# Patient Record
Sex: Male | Born: 1935 | Race: White | Hispanic: No | Marital: Married | Smoking: Former smoker
Health system: Southern US, Community
[De-identification: ages and names within clinical notes are randomized; demographics above are authoritative.]

## PROBLEM LIST (undated history)

## (undated) DIAGNOSIS — G309 Alzheimer's disease, unspecified: Secondary | ICD-10-CM

## (undated) DIAGNOSIS — I10 Essential (primary) hypertension: Secondary | ICD-10-CM

## (undated) DIAGNOSIS — F03918 Unspecified dementia, unspecified severity, with other behavioral disturbance: Secondary | ICD-10-CM

## (undated) DIAGNOSIS — F0391 Unspecified dementia with behavioral disturbance: Secondary | ICD-10-CM

## (undated) DIAGNOSIS — I495 Sick sinus syndrome: Secondary | ICD-10-CM

## (undated) DIAGNOSIS — G629 Polyneuropathy, unspecified: Secondary | ICD-10-CM

## (undated) DIAGNOSIS — C61 Malignant neoplasm of prostate: Secondary | ICD-10-CM

## (undated) DIAGNOSIS — E785 Hyperlipidemia, unspecified: Secondary | ICD-10-CM

## (undated) DIAGNOSIS — I4891 Unspecified atrial fibrillation: Secondary | ICD-10-CM

## (undated) DIAGNOSIS — I25810 Atherosclerosis of coronary artery bypass graft(s) without angina pectoris: Secondary | ICD-10-CM

## (undated) DIAGNOSIS — F0280 Dementia in other diseases classified elsewhere without behavioral disturbance: Secondary | ICD-10-CM

## (undated) DIAGNOSIS — N289 Disorder of kidney and ureter, unspecified: Secondary | ICD-10-CM

## (undated) HISTORY — DX: Essential (primary) hypertension: I10

## (undated) HISTORY — DX: Alzheimer's disease, unspecified: G30.9

## (undated) HISTORY — DX: Unspecified atrial fibrillation: I48.91

## (undated) HISTORY — PX: SPERMATOCELECTOMY: SHX2420

## (undated) HISTORY — DX: Sick sinus syndrome: I49.5

## (undated) HISTORY — DX: Unspecified dementia, unspecified severity, with other behavioral disturbance: F03.918

## (undated) HISTORY — DX: Disorder of kidney and ureter, unspecified: N28.9

## (undated) HISTORY — PX: OTHER SURGICAL HISTORY: SHX169

## (undated) HISTORY — PX: COLONOSCOPY: SHX174

## (undated) HISTORY — PX: PACEMAKER INSERTION: SHX728

## (undated) HISTORY — DX: Hyperlipidemia, unspecified: E78.5

## (undated) HISTORY — PX: INGUINAL HERNIA REPAIR: SUR1180

## (undated) HISTORY — DX: Atherosclerosis of coronary artery bypass graft(s) without angina pectoris: I25.810

## (undated) HISTORY — DX: Malignant neoplasm of prostate: C61

## (undated) HISTORY — PX: TONSILLECTOMY: SUR1361

## (undated) HISTORY — DX: Unspecified dementia with behavioral disturbance: F03.91

## (undated) HISTORY — DX: Dementia in other diseases classified elsewhere, unspecified severity, without behavioral disturbance, psychotic disturbance, mood disturbance, and anxiety: F02.80

---

## 1989-07-15 DIAGNOSIS — I25810 Atherosclerosis of coronary artery bypass graft(s) without angina pectoris: Secondary | ICD-10-CM

## 1989-07-15 HISTORY — PX: CORONARY ARTERY BYPASS GRAFT: SHX141

## 1989-07-15 HISTORY — DX: Atherosclerosis of coronary artery bypass graft(s) without angina pectoris: I25.810

## 1999-09-19 ENCOUNTER — Other Ambulatory Visit: Admission: RE | Admit: 1999-09-19 | Discharge: 1999-09-19 | Payer: Self-pay | Admitting: Urology

## 1999-10-08 ENCOUNTER — Encounter: Admission: RE | Admit: 1999-10-08 | Discharge: 2000-01-06 | Payer: Self-pay | Admitting: Radiation Oncology

## 2001-08-19 ENCOUNTER — Ambulatory Visit (HOSPITAL_COMMUNITY): Admission: RE | Admit: 2001-08-19 | Discharge: 2001-08-19 | Payer: Self-pay | Admitting: Gastroenterology

## 2002-07-20 ENCOUNTER — Encounter: Payer: Self-pay | Admitting: Surgery

## 2002-07-20 ENCOUNTER — Encounter: Admission: RE | Admit: 2002-07-20 | Discharge: 2002-07-20 | Payer: Self-pay | Admitting: Surgery

## 2002-07-21 ENCOUNTER — Encounter (INDEPENDENT_AMBULATORY_CARE_PROVIDER_SITE_OTHER): Payer: Self-pay | Admitting: *Deleted

## 2002-07-21 ENCOUNTER — Ambulatory Visit (HOSPITAL_BASED_OUTPATIENT_CLINIC_OR_DEPARTMENT_OTHER): Admission: RE | Admit: 2002-07-21 | Discharge: 2002-07-21 | Payer: Self-pay | Admitting: Surgery

## 2004-03-13 ENCOUNTER — Ambulatory Visit (HOSPITAL_COMMUNITY): Admission: RE | Admit: 2004-03-13 | Discharge: 2004-03-13 | Payer: Self-pay | Admitting: Surgery

## 2004-03-13 ENCOUNTER — Encounter (INDEPENDENT_AMBULATORY_CARE_PROVIDER_SITE_OTHER): Payer: Self-pay | Admitting: *Deleted

## 2004-03-13 ENCOUNTER — Ambulatory Visit (HOSPITAL_BASED_OUTPATIENT_CLINIC_OR_DEPARTMENT_OTHER): Admission: RE | Admit: 2004-03-13 | Discharge: 2004-03-13 | Payer: Self-pay | Admitting: Surgery

## 2004-05-15 ENCOUNTER — Ambulatory Visit: Payer: Self-pay

## 2004-06-12 ENCOUNTER — Ambulatory Visit: Payer: Self-pay | Admitting: *Deleted

## 2004-06-13 ENCOUNTER — Ambulatory Visit: Payer: Self-pay

## 2004-06-14 ENCOUNTER — Ambulatory Visit: Payer: Self-pay | Admitting: *Deleted

## 2004-06-19 ENCOUNTER — Ambulatory Visit: Payer: Self-pay | Admitting: *Deleted

## 2004-06-27 ENCOUNTER — Ambulatory Visit: Payer: Self-pay | Admitting: Cardiology

## 2004-07-24 ENCOUNTER — Ambulatory Visit: Payer: Self-pay | Admitting: Cardiology

## 2004-08-09 ENCOUNTER — Ambulatory Visit: Payer: Self-pay | Admitting: Cardiology

## 2004-08-11 ENCOUNTER — Ambulatory Visit: Payer: Self-pay | Admitting: Cardiology

## 2004-08-21 ENCOUNTER — Ambulatory Visit: Payer: Self-pay | Admitting: Cardiology

## 2004-09-05 ENCOUNTER — Ambulatory Visit: Payer: Self-pay | Admitting: *Deleted

## 2004-09-14 ENCOUNTER — Ambulatory Visit: Payer: Self-pay | Admitting: Cardiology

## 2004-09-18 ENCOUNTER — Ambulatory Visit: Payer: Self-pay | Admitting: Cardiology

## 2004-10-15 ENCOUNTER — Ambulatory Visit: Payer: Self-pay | Admitting: Cardiology

## 2004-10-16 ENCOUNTER — Ambulatory Visit: Payer: Self-pay | Admitting: Cardiology

## 2004-11-06 ENCOUNTER — Ambulatory Visit: Payer: Self-pay | Admitting: *Deleted

## 2004-11-20 ENCOUNTER — Ambulatory Visit: Payer: Self-pay | Admitting: Cardiology

## 2004-12-04 ENCOUNTER — Ambulatory Visit: Payer: Self-pay | Admitting: Internal Medicine

## 2004-12-21 ENCOUNTER — Ambulatory Visit: Payer: Self-pay | Admitting: Cardiology

## 2005-01-08 ENCOUNTER — Ambulatory Visit: Payer: Self-pay | Admitting: Cardiology

## 2005-01-28 ENCOUNTER — Ambulatory Visit: Payer: Self-pay | Admitting: Cardiology

## 2005-02-05 ENCOUNTER — Ambulatory Visit: Payer: Self-pay | Admitting: Cardiology

## 2005-02-11 ENCOUNTER — Ambulatory Visit: Payer: Self-pay | Admitting: *Deleted

## 2005-02-14 ENCOUNTER — Ambulatory Visit: Payer: Self-pay | Admitting: *Deleted

## 2005-02-21 ENCOUNTER — Ambulatory Visit: Payer: Self-pay

## 2005-02-28 ENCOUNTER — Encounter (INDEPENDENT_AMBULATORY_CARE_PROVIDER_SITE_OTHER): Payer: Self-pay | Admitting: Specialist

## 2005-02-28 ENCOUNTER — Ambulatory Visit (HOSPITAL_COMMUNITY): Admission: RE | Admit: 2005-02-28 | Discharge: 2005-02-28 | Payer: Self-pay | Admitting: Urology

## 2005-03-07 ENCOUNTER — Ambulatory Visit: Payer: Self-pay | Admitting: Cardiology

## 2005-03-22 ENCOUNTER — Ambulatory Visit: Payer: Self-pay | Admitting: Internal Medicine

## 2005-04-02 ENCOUNTER — Ambulatory Visit: Payer: Self-pay | Admitting: Cardiology

## 2005-04-15 ENCOUNTER — Ambulatory Visit: Payer: Self-pay | Admitting: Cardiology

## 2005-04-23 ENCOUNTER — Ambulatory Visit: Payer: Self-pay | Admitting: Cardiology

## 2005-05-15 ENCOUNTER — Ambulatory Visit: Payer: Self-pay | Admitting: Cardiology

## 2005-05-21 ENCOUNTER — Ambulatory Visit: Payer: Self-pay | Admitting: Cardiology

## 2005-05-23 ENCOUNTER — Ambulatory Visit: Payer: Self-pay | Admitting: Internal Medicine

## 2005-06-11 ENCOUNTER — Ambulatory Visit: Payer: Self-pay | Admitting: *Deleted

## 2005-06-25 ENCOUNTER — Ambulatory Visit: Payer: Self-pay | Admitting: Cardiology

## 2005-07-11 ENCOUNTER — Ambulatory Visit: Payer: Self-pay | Admitting: Cardiology

## 2005-07-23 ENCOUNTER — Ambulatory Visit: Payer: Self-pay | Admitting: Cardiology

## 2005-08-06 ENCOUNTER — Ambulatory Visit: Payer: Self-pay | Admitting: *Deleted

## 2005-08-13 ENCOUNTER — Ambulatory Visit: Payer: Self-pay | Admitting: Cardiology

## 2005-08-20 ENCOUNTER — Ambulatory Visit: Payer: Self-pay | Admitting: Cardiology

## 2005-09-13 ENCOUNTER — Ambulatory Visit: Payer: Self-pay | Admitting: Cardiology

## 2005-09-17 ENCOUNTER — Ambulatory Visit: Payer: Self-pay | Admitting: Cardiology

## 2005-10-14 ENCOUNTER — Ambulatory Visit: Payer: Self-pay | Admitting: Cardiology

## 2005-10-15 ENCOUNTER — Ambulatory Visit: Payer: Self-pay | Admitting: Cardiology

## 2005-11-11 ENCOUNTER — Ambulatory Visit: Payer: Self-pay | Admitting: Cardiology

## 2005-11-12 ENCOUNTER — Ambulatory Visit: Payer: Self-pay | Admitting: *Deleted

## 2005-11-12 ENCOUNTER — Ambulatory Visit: Payer: Self-pay | Admitting: Cardiology

## 2005-12-06 ENCOUNTER — Ambulatory Visit: Payer: Self-pay | Admitting: Cardiology

## 2005-12-10 ENCOUNTER — Ambulatory Visit: Payer: Self-pay | Admitting: Cardiology

## 2006-01-07 ENCOUNTER — Ambulatory Visit: Payer: Self-pay | Admitting: Cardiology

## 2006-01-08 ENCOUNTER — Ambulatory Visit: Payer: Self-pay | Admitting: Cardiology

## 2006-02-03 ENCOUNTER — Ambulatory Visit: Payer: Self-pay | Admitting: Cardiology

## 2006-02-04 ENCOUNTER — Ambulatory Visit: Payer: Self-pay | Admitting: Cardiology

## 2006-03-04 ENCOUNTER — Ambulatory Visit: Payer: Self-pay | Admitting: Internal Medicine

## 2006-03-06 ENCOUNTER — Ambulatory Visit: Payer: Self-pay | Admitting: Cardiology

## 2006-04-01 ENCOUNTER — Ambulatory Visit: Payer: Self-pay | Admitting: Cardiology

## 2006-04-07 ENCOUNTER — Ambulatory Visit: Payer: Self-pay | Admitting: Cardiology

## 2006-04-29 ENCOUNTER — Ambulatory Visit: Payer: Self-pay | Admitting: *Deleted

## 2006-04-29 ENCOUNTER — Ambulatory Visit: Payer: Self-pay | Admitting: Cardiology

## 2006-05-06 ENCOUNTER — Ambulatory Visit: Payer: Self-pay | Admitting: Cardiology

## 2006-05-09 ENCOUNTER — Ambulatory Visit: Payer: Self-pay | Admitting: *Deleted

## 2006-05-09 ENCOUNTER — Ambulatory Visit: Payer: Self-pay | Admitting: Internal Medicine

## 2006-05-09 LAB — CONVERTED CEMR LAB
ALT: 23 units/L (ref 0–40)
AST: 22 units/L (ref 0–37)
Albumin: 3.9 g/dL (ref 3.5–5.2)
Alkaline Phosphatase: 66 units/L (ref 39–117)
BUN: 19 mg/dL (ref 6–23)
CO2: 29 meq/L (ref 19–32)
Calcium: 9 mg/dL (ref 8.4–10.5)
Chloride: 106 meq/L (ref 96–112)
Chol/HDL Ratio, serum: 2.8
Cholesterol: 113 mg/dL (ref 0–200)
Creatinine, Ser: 1.6 mg/dL — ABNORMAL HIGH (ref 0.4–1.5)
GFR calc non Af Amer: 45 mL/min
Glomerular Filtration Rate, Af Am: 54 mL/min/{1.73_m2}
Glucose, Bld: 95 mg/dL (ref 70–99)
HDL: 39.8 mg/dL (ref >39.0)
Hgb A1c MFr Bld: 6.2 % — ABNORMAL HIGH (ref 4.6–6.0)
LDL Cholesterol: 56 mg/dL (ref 0–99)
PSA: 0.52 ng/mL (ref 0.10–4.00)
Potassium: 4.4 meq/L (ref 3.5–5.1)
Sodium: 141 meq/L (ref 135–145)
Total Bilirubin: 1.2 mg/dL (ref 0.3–1.2)
Total Protein: 6.9 g/dL (ref 6.0–8.3)
Triglyceride fasting, serum: 84 mg/dL (ref 0–149)
VLDL: 17 mg/dL (ref 0–40)

## 2006-05-15 ENCOUNTER — Ambulatory Visit: Payer: Self-pay | Admitting: *Deleted

## 2006-05-22 ENCOUNTER — Ambulatory Visit: Payer: Self-pay | Admitting: Internal Medicine

## 2006-05-27 ENCOUNTER — Ambulatory Visit: Payer: Self-pay | Admitting: Internal Medicine

## 2006-06-03 ENCOUNTER — Ambulatory Visit: Payer: Self-pay | Admitting: Cardiology

## 2006-06-10 ENCOUNTER — Ambulatory Visit: Payer: Self-pay | Admitting: Cardiology

## 2006-06-19 ENCOUNTER — Ambulatory Visit: Payer: Self-pay | Admitting: Cardiology

## 2006-07-09 ENCOUNTER — Ambulatory Visit: Payer: Self-pay | Admitting: Cardiology

## 2006-07-09 LAB — CONVERTED CEMR LAB
BUN: 20 mg/dL (ref 6–23)
CO2: 33 meq/L — ABNORMAL HIGH (ref 19–32)
Calcium: 9.2 mg/dL (ref 8.4–10.5)
Chloride: 106 meq/L (ref 96–112)
Creatinine, Ser: 1.5 mg/dL (ref 0.4–1.5)
GFR calc non Af Amer: 48 mL/min
Glomerular Filtration Rate, Af Am: 58 mL/min/{1.73_m2}
Glucose, Bld: 115 mg/dL — ABNORMAL HIGH (ref 70–99)
HCT: 46.6 % (ref 39.0–52.0)
Hemoglobin: 15.7 g/dL (ref 13.0–17.0)
INR: 1.4 (ref 0.9–2.0)
MCHC: 33.7 g/dL (ref 30.0–36.0)
MCV: 92.6 fL (ref 78.0–100.0)
Platelets: 148 10*3/uL — ABNORMAL LOW (ref 150–400)
Potassium: 4.3 meq/L (ref 3.5–5.1)
Prothrombin Time: 14.5 s — ABNORMAL HIGH (ref 10.0–14.0)
RBC: 5.03 M/uL (ref 4.22–5.81)
RDW: 14.4 % (ref 11.5–14.6)
Sodium: 143 meq/L (ref 135–145)
WBC: 7.2 10*3/uL (ref 4.5–10.5)
aPTT: 75 s — ABNORMAL HIGH (ref 26.5–36.5)

## 2006-07-10 ENCOUNTER — Ambulatory Visit (HOSPITAL_COMMUNITY): Admission: RE | Admit: 2006-07-10 | Discharge: 2006-07-10 | Payer: Self-pay | Admitting: Cardiology

## 2006-07-10 ENCOUNTER — Ambulatory Visit: Payer: Self-pay | Admitting: Cardiology

## 2006-07-17 ENCOUNTER — Ambulatory Visit: Payer: Self-pay | Admitting: Cardiology

## 2006-07-24 ENCOUNTER — Ambulatory Visit: Payer: Self-pay | Admitting: Cardiology

## 2006-07-28 ENCOUNTER — Ambulatory Visit: Payer: Self-pay

## 2006-08-13 ENCOUNTER — Ambulatory Visit: Payer: Self-pay | Admitting: Cardiology

## 2006-08-19 ENCOUNTER — Ambulatory Visit: Payer: Self-pay | Admitting: Cardiology

## 2006-09-16 ENCOUNTER — Ambulatory Visit: Payer: Self-pay | Admitting: Internal Medicine

## 2006-10-14 ENCOUNTER — Ambulatory Visit: Payer: Self-pay | Admitting: Internal Medicine

## 2006-10-23 ENCOUNTER — Ambulatory Visit: Payer: Self-pay | Admitting: *Deleted

## 2006-10-23 LAB — CONVERTED CEMR LAB
Calcium: 8.9 mg/dL (ref 8.4–10.5)
Chloride: 110 meq/L (ref 96–112)
GFR calc non Af Amer: 52 mL/min
HDL: 38.8 mg/dL — ABNORMAL LOW (ref >39.0)
LDL Cholesterol: 44 mg/dL (ref 0–99)
Sodium: 144 meq/L (ref 135–145)
VLDL: 20 mg/dL (ref 0–40)

## 2006-10-28 ENCOUNTER — Ambulatory Visit: Payer: Self-pay | Admitting: *Deleted

## 2006-11-11 ENCOUNTER — Ambulatory Visit: Payer: Self-pay | Admitting: Cardiology

## 2006-11-25 ENCOUNTER — Ambulatory Visit: Payer: Self-pay | Admitting: Cardiology

## 2006-12-23 ENCOUNTER — Ambulatory Visit: Payer: Self-pay | Admitting: Internal Medicine

## 2007-01-20 ENCOUNTER — Ambulatory Visit: Payer: Self-pay | Admitting: Cardiology

## 2007-01-29 ENCOUNTER — Ambulatory Visit: Payer: Self-pay | Admitting: Cardiology

## 2007-02-17 ENCOUNTER — Ambulatory Visit: Payer: Self-pay | Admitting: Cardiology

## 2007-03-04 ENCOUNTER — Ambulatory Visit: Payer: Self-pay | Admitting: Internal Medicine

## 2007-03-17 ENCOUNTER — Ambulatory Visit: Payer: Self-pay | Admitting: Internal Medicine

## 2007-04-14 ENCOUNTER — Ambulatory Visit: Payer: Self-pay | Admitting: Cardiology

## 2007-04-20 ENCOUNTER — Ambulatory Visit: Payer: Self-pay | Admitting: Internal Medicine

## 2007-04-28 DIAGNOSIS — M109 Gout, unspecified: Secondary | ICD-10-CM

## 2007-04-28 DIAGNOSIS — Z8546 Personal history of malignant neoplasm of prostate: Secondary | ICD-10-CM

## 2007-04-28 DIAGNOSIS — I2510 Atherosclerotic heart disease of native coronary artery without angina pectoris: Secondary | ICD-10-CM

## 2007-04-28 DIAGNOSIS — I4891 Unspecified atrial fibrillation: Secondary | ICD-10-CM

## 2007-04-28 DIAGNOSIS — I10 Essential (primary) hypertension: Secondary | ICD-10-CM | POA: Insufficient documentation

## 2007-05-12 ENCOUNTER — Ambulatory Visit: Payer: Self-pay | Admitting: Cardiology

## 2007-05-25 ENCOUNTER — Encounter: Payer: Self-pay | Admitting: Internal Medicine

## 2007-05-26 ENCOUNTER — Ambulatory Visit: Payer: Self-pay | Admitting: Internal Medicine

## 2007-05-26 DIAGNOSIS — E119 Type 2 diabetes mellitus without complications: Secondary | ICD-10-CM

## 2007-05-26 DIAGNOSIS — E785 Hyperlipidemia, unspecified: Secondary | ICD-10-CM | POA: Insufficient documentation

## 2007-05-28 LAB — CONVERTED CEMR LAB
ALT: 27 units/L (ref 0–53)
AST: 28 units/L (ref 0–37)
Albumin: 3.9 g/dL (ref 3.5–5.2)
Alkaline Phosphatase: 68 units/L (ref 39–117)
Bilirubin, Direct: 0.2 mg/dL (ref 0.0–0.3)
Cholesterol: 117 mg/dL (ref 0–200)
HDL: 44.4 mg/dL (ref >39.0)
Hgb A1c MFr Bld: 6 % (ref 4.6–6.0)
LDL Cholesterol: 54 mg/dL (ref 0–99)
Total Bilirubin: 0.9 mg/dL (ref 0.3–1.2)
Total CHOL/HDL Ratio: 2.6
Total Protein: 7 g/dL (ref 6.0–8.3)
Triglycerides: 92 mg/dL (ref 0–149)
VLDL: 18 mg/dL (ref 0–40)

## 2007-06-30 ENCOUNTER — Ambulatory Visit: Payer: Self-pay | Admitting: Cardiology

## 2007-07-23 ENCOUNTER — Ambulatory Visit: Payer: Self-pay | Admitting: Cardiology

## 2007-07-28 ENCOUNTER — Ambulatory Visit: Payer: Self-pay

## 2007-07-28 ENCOUNTER — Encounter: Payer: Self-pay | Admitting: Internal Medicine

## 2007-07-28 ENCOUNTER — Ambulatory Visit: Payer: Self-pay | Admitting: Cardiology

## 2007-07-28 LAB — CONVERTED CEMR LAB
Basophils Relative: 0.7 % (ref 0.0–1.0)
CO2: 30 meq/L (ref 19–32)
Cholesterol: 110 mg/dL (ref 0–200)
Creatinine, Ser: 1.4 mg/dL (ref 0.4–1.5)
Eosinophils Relative: 7.4 % — ABNORMAL HIGH (ref 0.0–5.0)
HCT: 41.9 % (ref 39.0–52.0)
Hemoglobin: 14.1 g/dL (ref 13.0–17.0)
LDL Cholesterol: 54 mg/dL (ref 0–99)
Monocytes Absolute: 0.6 10*3/uL (ref 0.2–0.7)
Neutrophils Relative %: 51.6 % (ref 43.0–77.0)
Potassium: 4.5 meq/L (ref 3.5–5.1)
RDW: 13.8 % (ref 11.5–14.6)
Sodium: 143 meq/L (ref 135–145)
Total Bilirubin: 1.1 mg/dL (ref 0.3–1.2)
Total Protein: 6.7 g/dL (ref 6.0–8.3)
VLDL: 12 mg/dL (ref 0–40)

## 2007-08-11 ENCOUNTER — Ambulatory Visit: Payer: Self-pay | Admitting: Cardiology

## 2007-09-08 ENCOUNTER — Ambulatory Visit: Payer: Self-pay | Admitting: Internal Medicine

## 2007-10-06 ENCOUNTER — Ambulatory Visit: Payer: Self-pay | Admitting: Cardiology

## 2007-11-03 ENCOUNTER — Ambulatory Visit: Payer: Self-pay | Admitting: Cardiovascular Disease

## 2007-11-24 ENCOUNTER — Ambulatory Visit: Payer: Self-pay | Admitting: Cardiology

## 2007-11-25 ENCOUNTER — Ambulatory Visit: Payer: Self-pay | Admitting: Cardiology

## 2007-12-22 ENCOUNTER — Ambulatory Visit: Payer: Self-pay | Admitting: Cardiovascular Disease

## 2007-12-25 ENCOUNTER — Telehealth: Payer: Self-pay | Admitting: Internal Medicine

## 2008-01-19 ENCOUNTER — Ambulatory Visit: Payer: Self-pay | Admitting: Cardiology

## 2008-02-03 ENCOUNTER — Ambulatory Visit: Payer: Self-pay

## 2008-02-16 ENCOUNTER — Ambulatory Visit: Payer: Self-pay | Admitting: Cardiology

## 2008-02-23 ENCOUNTER — Encounter: Payer: Self-pay | Admitting: Internal Medicine

## 2008-03-15 ENCOUNTER — Ambulatory Visit: Payer: Self-pay | Admitting: Cardiology

## 2008-04-12 ENCOUNTER — Ambulatory Visit: Payer: Self-pay | Admitting: Internal Medicine

## 2008-04-19 ENCOUNTER — Ambulatory Visit: Payer: Self-pay | Admitting: Cardiology

## 2008-04-19 ENCOUNTER — Telehealth: Payer: Self-pay | Admitting: Internal Medicine

## 2008-05-17 ENCOUNTER — Ambulatory Visit: Payer: Self-pay | Admitting: Internal Medicine

## 2008-05-25 ENCOUNTER — Ambulatory Visit: Payer: Self-pay | Admitting: Internal Medicine

## 2008-05-25 DIAGNOSIS — S300XXA Contusion of lower back and pelvis, initial encounter: Secondary | ICD-10-CM

## 2008-06-07 ENCOUNTER — Ambulatory Visit: Payer: Self-pay | Admitting: Cardiovascular Disease

## 2008-07-05 ENCOUNTER — Ambulatory Visit: Payer: Self-pay | Admitting: Cardiovascular Disease

## 2008-07-13 ENCOUNTER — Ambulatory Visit: Payer: Self-pay | Admitting: Internal Medicine

## 2008-07-14 ENCOUNTER — Telehealth: Payer: Self-pay | Admitting: Internal Medicine

## 2008-07-14 ENCOUNTER — Ambulatory Visit: Payer: Self-pay | Admitting: Internal Medicine

## 2008-07-14 LAB — CONVERTED CEMR LAB
ALT: 29 units/L (ref 0–53)
Albumin: 4 g/dL (ref 3.5–5.2)
Basophils Relative: 0.1 % (ref 0.0–3.0)
Bilirubin, Direct: 0.1 mg/dL (ref 0.0–0.3)
Calcium: 9.3 mg/dL (ref 8.4–10.5)
Cholesterol: 123 mg/dL (ref 0–200)
Creatinine, Ser: 1.3 mg/dL (ref 0.4–1.5)
GFR calc Af Amer: 68 mL/min
Glucose, Bld: 79 mg/dL (ref 70–99)
HCT: 41.7 % (ref 39.0–52.0)
Hemoglobin: 14.5 g/dL (ref 13.0–17.0)
MCHC: 34.8 g/dL (ref 30.0–36.0)
Monocytes Absolute: 0.6 10*3/uL (ref 0.1–1.0)
Monocytes Relative: 8.1 % (ref 3.0–12.0)
Neutro Abs: 5 10*3/uL (ref 1.4–7.7)
PSA: 0.4 ng/mL (ref 0.10–4.00)
RBC: 4.44 M/uL (ref 4.22–5.81)
RDW: 13.2 % (ref 11.5–14.6)
Sodium: 141 meq/L (ref 135–145)
TSH: 1.34 microintl units/mL (ref 0.35–5.50)
Total CHOL/HDL Ratio: 2.6
Total Protein: 6.9 g/dL (ref 6.0–8.3)
Triglycerides: 83 mg/dL (ref 0–149)

## 2008-07-25 ENCOUNTER — Encounter: Payer: Self-pay | Admitting: Internal Medicine

## 2008-07-26 ENCOUNTER — Ambulatory Visit: Payer: Self-pay | Admitting: Internal Medicine

## 2008-08-04 ENCOUNTER — Ambulatory Visit: Payer: Self-pay | Admitting: Cardiology

## 2008-08-05 ENCOUNTER — Ambulatory Visit: Payer: Self-pay | Admitting: Internal Medicine

## 2008-08-16 ENCOUNTER — Ambulatory Visit: Payer: Self-pay | Admitting: Cardiology

## 2008-08-16 ENCOUNTER — Ambulatory Visit: Payer: Self-pay | Admitting: Internal Medicine

## 2008-08-23 ENCOUNTER — Ambulatory Visit: Payer: Self-pay | Admitting: Internal Medicine

## 2008-08-30 ENCOUNTER — Ambulatory Visit: Payer: Self-pay | Admitting: Internal Medicine

## 2008-09-06 ENCOUNTER — Ambulatory Visit: Payer: Self-pay | Admitting: Internal Medicine

## 2008-09-06 ENCOUNTER — Encounter: Payer: Self-pay | Admitting: Internal Medicine

## 2008-09-20 ENCOUNTER — Ambulatory Visit: Payer: Self-pay | Admitting: Internal Medicine

## 2008-09-21 ENCOUNTER — Encounter: Payer: Self-pay | Admitting: Internal Medicine

## 2008-09-23 ENCOUNTER — Telehealth: Payer: Self-pay | Admitting: Internal Medicine

## 2008-10-18 ENCOUNTER — Encounter: Payer: Self-pay | Admitting: Internal Medicine

## 2008-10-18 ENCOUNTER — Ambulatory Visit: Payer: Self-pay | Admitting: Internal Medicine

## 2008-10-25 ENCOUNTER — Encounter: Payer: Self-pay | Admitting: Internal Medicine

## 2008-11-22 ENCOUNTER — Ambulatory Visit: Payer: Self-pay | Admitting: Internal Medicine

## 2008-12-13 ENCOUNTER — Ambulatory Visit: Payer: Self-pay | Admitting: Internal Medicine

## 2008-12-13 ENCOUNTER — Encounter: Payer: Self-pay | Admitting: *Deleted

## 2008-12-15 ENCOUNTER — Encounter: Payer: Self-pay | Admitting: Internal Medicine

## 2008-12-20 ENCOUNTER — Ambulatory Visit: Payer: Self-pay | Admitting: Internal Medicine

## 2008-12-29 ENCOUNTER — Ambulatory Visit: Payer: Self-pay | Admitting: Internal Medicine

## 2009-01-02 ENCOUNTER — Telehealth: Payer: Self-pay | Admitting: Internal Medicine

## 2009-01-05 ENCOUNTER — Encounter: Payer: Self-pay | Admitting: Cardiology

## 2009-01-05 ENCOUNTER — Ambulatory Visit: Payer: Self-pay | Admitting: Internal Medicine

## 2009-01-12 ENCOUNTER — Ambulatory Visit: Payer: Self-pay | Admitting: Internal Medicine

## 2009-01-13 ENCOUNTER — Encounter: Payer: Self-pay | Admitting: Cardiovascular Disease

## 2009-01-13 ENCOUNTER — Encounter: Payer: Self-pay | Admitting: Internal Medicine

## 2009-01-18 ENCOUNTER — Encounter: Payer: Self-pay | Admitting: *Deleted

## 2009-02-01 ENCOUNTER — Ambulatory Visit: Payer: Self-pay | Admitting: Internal Medicine

## 2009-02-01 ENCOUNTER — Ambulatory Visit: Payer: Self-pay | Admitting: Cardiology

## 2009-02-01 DIAGNOSIS — K625 Hemorrhage of anus and rectum: Secondary | ICD-10-CM | POA: Insufficient documentation

## 2009-02-01 LAB — CONVERTED CEMR LAB
Basophils Absolute: 0 10*3/uL (ref 0.0–0.1)
Eosinophils Relative: 2.8 % (ref 0.0–5.0)
Lymphocytes Relative: 23.1 % (ref 12.0–46.0)
Monocytes Relative: 10.6 % (ref 3.0–12.0)
Neutrophils Relative %: 63 % (ref 43.0–77.0)
Platelets: 109 10*3/uL — ABNORMAL LOW (ref 150.0–400.0)
RDW: 14.3 % (ref 11.5–14.6)
WBC: 7.4 10*3/uL (ref 4.5–10.5)

## 2009-02-11 DIAGNOSIS — I495 Sick sinus syndrome: Secondary | ICD-10-CM

## 2009-02-13 ENCOUNTER — Ambulatory Visit: Payer: Self-pay | Admitting: Internal Medicine

## 2009-02-13 ENCOUNTER — Ambulatory Visit: Payer: Self-pay | Admitting: Cardiology

## 2009-02-13 ENCOUNTER — Encounter: Payer: Self-pay | Admitting: Internal Medicine

## 2009-02-27 ENCOUNTER — Ambulatory Visit: Payer: Self-pay | Admitting: Internal Medicine

## 2009-03-14 ENCOUNTER — Ambulatory Visit: Payer: Self-pay | Admitting: Internal Medicine

## 2009-03-14 ENCOUNTER — Encounter: Payer: Self-pay | Admitting: Internal Medicine

## 2009-03-14 ENCOUNTER — Encounter: Payer: Self-pay | Admitting: Cardiology

## 2009-03-30 ENCOUNTER — Telehealth: Payer: Self-pay | Admitting: Internal Medicine

## 2009-04-12 ENCOUNTER — Encounter: Payer: Self-pay | Admitting: Cardiology

## 2009-04-12 ENCOUNTER — Ambulatory Visit: Payer: Self-pay | Admitting: Internal Medicine

## 2009-04-12 LAB — CONVERTED CEMR LAB
Basophils Relative: 0.2 % (ref 0.0–3.0)
Eosinophils Relative: 3.2 % (ref 0.0–5.0)
HCT: 42.7 % (ref 39.0–52.0)
Lymphs Abs: 1.2 10*3/uL (ref 0.7–4.0)
MCV: 91.8 fL (ref 78.0–100.0)
Monocytes Absolute: 0.7 10*3/uL (ref 0.1–1.0)
Monocytes Relative: 9.9 % (ref 3.0–12.0)
Platelets: 116 10*3/uL — ABNORMAL LOW (ref 150.0–400.0)
RBC: 4.65 M/uL (ref 4.22–5.81)
WBC: 7 10*3/uL (ref 4.5–10.5)

## 2009-04-25 ENCOUNTER — Ambulatory Visit: Payer: Self-pay | Admitting: Internal Medicine

## 2009-05-08 ENCOUNTER — Ambulatory Visit: Payer: Self-pay | Admitting: Internal Medicine

## 2009-05-16 ENCOUNTER — Encounter: Payer: Self-pay | Admitting: Internal Medicine

## 2009-05-16 ENCOUNTER — Encounter: Payer: Self-pay | Admitting: Cardiology

## 2009-05-16 ENCOUNTER — Ambulatory Visit: Payer: Self-pay | Admitting: Internal Medicine

## 2009-05-19 ENCOUNTER — Encounter: Payer: Self-pay | Admitting: Internal Medicine

## 2009-05-23 ENCOUNTER — Ambulatory Visit: Payer: Self-pay | Admitting: Internal Medicine

## 2009-05-24 ENCOUNTER — Encounter (INDEPENDENT_AMBULATORY_CARE_PROVIDER_SITE_OTHER): Payer: Self-pay | Admitting: *Deleted

## 2009-06-06 ENCOUNTER — Encounter (INDEPENDENT_AMBULATORY_CARE_PROVIDER_SITE_OTHER): Payer: Self-pay | Admitting: *Deleted

## 2009-06-13 ENCOUNTER — Encounter: Payer: Self-pay | Admitting: Internal Medicine

## 2009-06-13 ENCOUNTER — Ambulatory Visit: Payer: Self-pay | Admitting: Internal Medicine

## 2009-06-14 ENCOUNTER — Encounter: Payer: Self-pay | Admitting: Cardiology

## 2009-06-22 ENCOUNTER — Encounter: Payer: Self-pay | Admitting: Cardiology

## 2009-07-17 ENCOUNTER — Ambulatory Visit: Payer: Self-pay | Admitting: Cardiology

## 2009-07-17 ENCOUNTER — Ambulatory Visit: Payer: Self-pay | Admitting: Internal Medicine

## 2009-07-18 ENCOUNTER — Telehealth: Payer: Self-pay | Admitting: Cardiology

## 2009-08-01 ENCOUNTER — Ambulatory Visit: Payer: Self-pay | Admitting: Internal Medicine

## 2009-08-16 ENCOUNTER — Ambulatory Visit: Payer: Self-pay | Admitting: Internal Medicine

## 2009-08-16 ENCOUNTER — Encounter: Payer: Self-pay | Admitting: Cardiology

## 2009-08-30 ENCOUNTER — Ambulatory Visit: Payer: Self-pay | Admitting: Internal Medicine

## 2009-09-13 ENCOUNTER — Ambulatory Visit: Payer: Self-pay | Admitting: Internal Medicine

## 2009-09-28 ENCOUNTER — Encounter: Payer: Self-pay | Admitting: Cardiology

## 2009-10-02 ENCOUNTER — Ambulatory Visit: Payer: Self-pay | Admitting: Internal Medicine

## 2009-10-02 LAB — CONVERTED CEMR LAB
Albumin: 4.4 g/dL (ref 3.5–5.2)
BUN: 21 mg/dL (ref 6–23)
Basophils Relative: 0.1 % (ref 0.0–3.0)
CO2: 29 meq/L (ref 19–32)
Calcium: 9.3 mg/dL (ref 8.4–10.5)
Eosinophils Relative: 2.4 % (ref 0.0–5.0)
GFR calc non Af Amer: 51.45 mL/min (ref >60)
Glucose, Bld: 96 mg/dL (ref 70–99)
HCT: 47.7 % (ref 39.0–52.0)
HDL: 59.9 mg/dL (ref >39.00)
Hemoglobin: 15.8 g/dL (ref 13.0–17.0)
LDL Cholesterol: 62 mg/dL (ref 0–99)
Lymphs Abs: 1.1 10*3/uL (ref 0.7–4.0)
MCV: 93.5 fL (ref 78.0–100.0)
Monocytes Absolute: 0.6 10*3/uL (ref 0.1–1.0)
Monocytes Relative: 8.4 % (ref 3.0–12.0)
Neutro Abs: 5.4 10*3/uL (ref 1.4–7.7)
Potassium: 4.3 meq/L (ref 3.5–5.1)
Sodium: 141 meq/L (ref 135–145)
Total Bilirubin: 1.7 mg/dL — ABNORMAL HIGH (ref 0.3–1.2)
Total CHOL/HDL Ratio: 2
Triglycerides: 74 mg/dL (ref 0.0–149.0)
WBC: 7.3 10*3/uL (ref 4.5–10.5)

## 2009-10-10 ENCOUNTER — Ambulatory Visit: Payer: Self-pay | Admitting: Internal Medicine

## 2009-10-26 ENCOUNTER — Telehealth (INDEPENDENT_AMBULATORY_CARE_PROVIDER_SITE_OTHER): Payer: Self-pay | Admitting: *Deleted

## 2009-11-01 ENCOUNTER — Ambulatory Visit: Payer: Self-pay | Admitting: Cardiology

## 2009-11-01 DIAGNOSIS — R413 Other amnesia: Secondary | ICD-10-CM | POA: Insufficient documentation

## 2009-11-02 LAB — CONVERTED CEMR LAB
Eosinophils Absolute: 0.3 10*3/uL (ref 0.0–0.7)
Eosinophils Relative: 3.3 % (ref 0.0–5.0)
HCT: 42 % (ref 39.0–52.0)
Lymphs Abs: 1.3 10*3/uL (ref 0.7–4.0)
MCHC: 33.9 g/dL (ref 30.0–36.0)
MCV: 90.9 fL (ref 78.0–100.0)
Monocytes Absolute: 0.8 10*3/uL (ref 0.1–1.0)
Platelets: 127 10*3/uL — ABNORMAL LOW (ref 150.0–400.0)
RDW: 15.3 % — ABNORMAL HIGH (ref 11.5–14.6)
TSH: 1.34 microintl units/mL (ref 0.35–5.50)

## 2009-11-09 ENCOUNTER — Ambulatory Visit: Payer: Self-pay | Admitting: Internal Medicine

## 2009-11-09 ENCOUNTER — Encounter: Payer: Self-pay | Admitting: Cardiology

## 2009-11-10 ENCOUNTER — Telehealth: Payer: Self-pay | Admitting: Cardiology

## 2009-11-27 ENCOUNTER — Encounter: Payer: Self-pay | Admitting: Cardiology

## 2009-11-28 ENCOUNTER — Ambulatory Visit: Payer: Self-pay | Admitting: Cardiology

## 2009-12-12 ENCOUNTER — Ambulatory Visit: Payer: Self-pay | Admitting: Internal Medicine

## 2009-12-13 ENCOUNTER — Telehealth (INDEPENDENT_AMBULATORY_CARE_PROVIDER_SITE_OTHER): Payer: Self-pay | Admitting: *Deleted

## 2009-12-13 ENCOUNTER — Encounter: Payer: Self-pay | Admitting: Cardiology

## 2009-12-21 ENCOUNTER — Encounter: Admission: RE | Admit: 2009-12-21 | Discharge: 2009-12-21 | Payer: Self-pay | Admitting: Neurology

## 2010-01-09 ENCOUNTER — Ambulatory Visit: Payer: Self-pay | Admitting: Internal Medicine

## 2010-02-06 ENCOUNTER — Encounter: Payer: Self-pay | Admitting: Cardiology

## 2010-02-06 ENCOUNTER — Ambulatory Visit: Payer: Self-pay | Admitting: Internal Medicine

## 2010-02-15 ENCOUNTER — Ambulatory Visit: Payer: Self-pay | Admitting: Internal Medicine

## 2010-02-22 ENCOUNTER — Encounter: Payer: Self-pay | Admitting: Internal Medicine

## 2010-02-26 ENCOUNTER — Ambulatory Visit: Payer: Self-pay | Admitting: Internal Medicine

## 2010-03-08 ENCOUNTER — Ambulatory Visit: Payer: Self-pay | Admitting: Internal Medicine

## 2010-03-22 ENCOUNTER — Telehealth: Payer: Self-pay | Admitting: Cardiology

## 2010-04-10 ENCOUNTER — Ambulatory Visit: Payer: Self-pay | Admitting: Internal Medicine

## 2010-04-20 ENCOUNTER — Ambulatory Visit: Payer: Self-pay | Admitting: Internal Medicine

## 2010-05-09 ENCOUNTER — Ambulatory Visit: Payer: Self-pay | Admitting: Internal Medicine

## 2010-05-11 ENCOUNTER — Encounter (INDEPENDENT_AMBULATORY_CARE_PROVIDER_SITE_OTHER): Payer: Self-pay | Admitting: *Deleted

## 2010-05-17 ENCOUNTER — Ambulatory Visit: Payer: Self-pay | Admitting: Cardiology

## 2010-05-23 ENCOUNTER — Ambulatory Visit: Payer: Self-pay | Admitting: Internal Medicine

## 2010-06-06 ENCOUNTER — Ambulatory Visit: Payer: Self-pay | Admitting: Internal Medicine

## 2010-07-10 ENCOUNTER — Ambulatory Visit: Payer: Self-pay | Admitting: Internal Medicine

## 2010-07-24 ENCOUNTER — Ambulatory Visit
Admission: RE | Admit: 2010-07-24 | Discharge: 2010-07-24 | Payer: Self-pay | Source: Home / Self Care | Attending: Internal Medicine | Admitting: Internal Medicine

## 2010-07-27 ENCOUNTER — Encounter: Payer: Self-pay | Admitting: Internal Medicine

## 2010-08-07 ENCOUNTER — Encounter: Payer: Self-pay | Admitting: Cardiology

## 2010-08-07 ENCOUNTER — Ambulatory Visit
Admission: RE | Admit: 2010-08-07 | Discharge: 2010-08-07 | Payer: Self-pay | Source: Home / Self Care | Attending: Internal Medicine | Admitting: Internal Medicine

## 2010-08-14 NOTE — Assessment & Plan Note (Signed)
Summary: rov      Allergies Added:   Visit Type:  Follow-up Primary Provider:  Norins  CC:  Pt dentist wanted pt to be seen he thinks a med may be causing problems with his gums.  History of Present Illness: The patient is 77 years old and return for management of CAD, atrial fibrillation and pacemaker. He also came because he is having some gum problems and there was a question whether this is related to his engage study drug.  He had bypass surgery in 1991. He has chronic atrial fibrillation and has a [employer]. Jude pacemaker with a generator change in 2007. He's been in the engage trial where he is on engage study drug versus Coumadin for about 12-18 months.  He recently saw his dentist and his dentist felt he had some gum hypertrophy as well as some periodontal disease. He is referring him to a gun specialist. Again his question whether his engage study drug might be related to his gum hypertrophy.  His wife indicated that he has also had some short-term memory problems over the past year. For example he had trouble remembering the food he ate about an hour ago when one of his children called and asked him.  Current Medications (verified): 1)  Engage Study Drug .... Take As Directed 2)  Lipitor 20 Mg Tabs (Atorvastatin Calcium) .... Take 1/2 Tab By Mouth At Bedtime 3)  Lotensin 40 Mg Tabs (Benazepril Hcl) .... Take 1/2 Tablet By Mouth Once A Day 4)  Lopressor 50 Mg Tabs (Metoprolol Tartrate) .Marland Kitchen.. 1 Tab Once Daily 5)  Viagra 100 Mg Tabs (Sildenafil Citrate) .... Use As Directed 6)  Aspirin 81 Mg  Tabs (Aspirin) .... Take 1 Tablet By Mouth Once A Day 7)  Diltiazem Hcl Cr 240 Mg Xr24h-Cap (Diltiazem Hcl) .Marland Kitchen.. 1 Tab Once Daily 8)  Flomax 0.4 Mg Xr24h-Cap (Tamsulosin Hcl) .... Take 1 Tablet By Mouth Once A Day 9)  Celebrex 100 Mg Caps (Celecoxib) .... As Needed 10)  Nitrostat 0.4 Mg Subl (Nitroglycerin) .... As Needed 11)  Fluticasone Propionate 50 Mcg/act Susp (Fluticasone Propionate) ....  Prn 12)  Sonata 10 Mg Caps (Zaleplon) .Marland Kitchen.. 1 At Bedtime  Allergies (verified): 1)  Amoxicillin (Amoxicillin) 2)  Sulfamethoxazole (Sulfamethoxazole) 3)  Tetanus Toxoid Adsorbed (Tetanus Toxoid Adsorbed)  Past History:  Past Medical History: Reviewed history from 02/11/2009 and no changes required.  Physician Roster:     Cardiology - Dr. Juanda Chance     [facility]- Dr. Etta Grandchild 1. Coronary artery status post coronary bypass graft surgery in 1991     with negative Myoview scan in January 2009. 2. Atrial fibrillation which has become chronic. 3. Status post dual-mode, dual-pacing, dual-sensing pacemaker     implantation with a generator change with a St. Jude Victory dual-     mode, dual-pacing, dual-sensing pacemaker in December 2007. 4. Hypertension, not quite in optimal control. 5. Hyperlipidemia. 6. History of prostate cancer treated with radiation therapy. 7. Moderate renal insufficiency.  8. Gout  Review of Systems       ROS is negative except as outlined in HPI.   Vital Signs:  Patient profile:   77 year old male Height:      73 inches Weight:      201 pounds BMI:     26.61 Pulse rate:   50 / minute BP sitting:   132 / 68  (left arm) Cuff size:   regular  Vitals Entered By: Burnett Kanaris, CNA (November 01, 2009 2:30  PM)  Physical Exam  Additional Exam:  Gen. Well-nourished, in no distress   Neck: No JVD, thyroid not enlarged, no carotid bruits Lungs: No tachypnea, clear without rales, rhonchi or wheezes Cardiovascular: Rhythm regular, PMI not displaced,  heart sounds  normal, no murmurs or gallops, no peripheral edema, pulses normal in all 4 extremities. Abdomen: BS normal, abdomen soft and non-tender without masses or organomegaly, no hepatosplenomegaly. MS: No deformities, no cyanosis or clubbing   Neuro:  No focal sns   Skin:  no lesions  He had some periodontal disease with gum recession but I could not appreciate any hypertrophy.   PPM Specifications Following MD:   Everardo Beals. Juanda Chance, MD     PPM Vendor:  St Jude     PPM Model Number:  [id number]     PPM Serial Number:  [id number] PPM DOI:  07/10/2006     PPM Implanting MD:  Everardo Beals. Juanda Chance, MD  Lead 1    Location: [facility]     DOI: 08/15/1997     Model #: [id number]     Serial #: [id number]     Status: active Lead 2    Location: [facility]     DOI: 08/15/1997     Model #: [id number]     Serial #: [id number]     Status: active   Indications:  SSS, AFIB, ON COUMADIN THERAPY   PPM Follow Up Pacer Dependent:  No      Episodes Coumadin:  Yes  Parameters Mode:  DDIR     Lower Rate Limit:  50     Upper Rate Limit:  110 Paced AV Delay:  250     Impression & Recommendations:  Problem # 1:  ATRIAL FIBRILLATION (ICD-427.31) He has chronic atrial fibrillation and is on the Engage study drug. we checked with the principal investigator and there is no reports of gum problems from the drug. I think this is unlikely. He plans to see a periodontist soon and if he has additional concerns he is to let us know. His updated medication list for this problem includes:    Lopressor 50 Mg Tabs (Metoprolol tartrate) .Marland Kitchen... 1 tab once daily    Aspirin 81 Mg Tabs (Aspirin) .Marland Kitchen... Take 1 tablet by mouth once a day  Orders: EKG w/ Interpretation (93000) TLB-CBC Platelet - w/Differential (85025-CBCD) TLB-TSH (Thyroid Stimulating Hormone) (84443-TSH) TLB-B12 + Folate Pnl (95638_75643-P29/JJO)  Problem # 2:  PACEMAKER (ICD-V45.Marland Kitchen01) He has a pacemaker programmed at [employer]. He did not interrogate this today. We may consider followup next year with Dr. Johney Frame for management of his pacemaker and medical problems following my retirement.  Problem # 3:  CORONARY ARTERY DISEASE (ICD-414.00) He had remote bypass surgery in 1991. He has had no recent chest pain this problem appears stable. His updated medication list for this problem includes:    Lotensin 40 Mg Tabs (Benazepril hcl) .Marland Kitchen... Take 1/2 tablet by mouth once a day    Lopressor 50 Mg Tabs (Metoprolol tartrate)  .Marland Kitchen... 1 tab once daily    Aspirin 81 Mg Tabs (Aspirin) .Marland Kitchen... Take 1 tablet by mouth once a day    Diltiazem Hcl Cr 240 Mg Xr24h-cap (Diltiazem hcl) .Marland Kitchen... 1 tab once daily    Nitrostat 0.4 Mg Subl (Nitroglycerin) .Marland Kitchen... As needed  Orders: EKG w/ Interpretation (93000) TLB-CBC Platelet - w/Differential (85025-CBCD) TLB-TSH (Thyroid Stimulating Hormone) (84443-TSH) TLB-B12 + Folate Pnl (84166_06301-S01/UXN)  Problem # 4:  MEMORY LOSS (ICD-780.93) He has had short-term memory problems in recent  months. His wife and children are quite concerned about it. His son-in-law is a physician in [place] who deals with emergency medicine. We will get a B12 level and a TSH to screen for problems with dementia. His wife has seen Dr. love.  We discussed a neurology referral and they would like to see Dr. love. Orders: EKG w/ Interpretation (93000) TLB-CBC Platelet - w/Differential (85025-CBCD) TLB-TSH (Thyroid Stimulating Hormone) (84443-TSH) TLB-B12 + Folate Pnl (13086_57846-N62/XBM) Neurology Referral (Neuro)  Patient Instructions: 1)  Your physician recommends that you have lab work today: tsh/cbc/B12 (414.01;427.31;402.10) 2)  Your physician wants you to follow-up in: 6 months.   You will receive a reminder letter in the mail two months in advance. If you don't receive a letter, please call our office to schedule the follow-up appointment. 3)  You are being referred to Dr. Sandria Manly in neurology.

## 2010-08-14 NOTE — Cardiovascular Report (Signed)
Summary: Office Visit   Office Visit   Imported By: Roderic Ovens 12/04/2009 16:30:18  _____________________________________________________________________  External Attachment:    Type:   Image     Comment:   External Document

## 2010-08-14 NOTE — Progress Notes (Signed)
Summary: discuss changing medication   Medications Added LOPRESSOR 50 MG TABS (METOPROLOL TARTRATE) take one tab by mouth two times a day AMLODIPINE BESYLATE 5 MG TABS (AMLODIPINE BESYLATE) Take one tablet by mouth daily       Phone Note Call from Patient Call back at Home Phone [phone]   Caller: Patient Reason for Call: Talk to Nurse Summary of Call: pt saw bb at hospital disuss changing some medication .  Initial call taken by: Lorne Skeens,  November 10, 2009 1:59 PM  Follow-up for Phone Call        [facility]. Sherri Rad, RN, BSN  November 10, 2009 6:02 PM  I spoke with pt this morning. He states he saw his dentist recently and was told that diltiazem was causing some problems for his gums. The pt states he saw Dr. Juanda Chance in the halls at the hospital last week and told Dr. Juanda Chance about this. Dr. Juanda Chance advised the pt to call the office so we can replace his diltiazem. He would like a local RX called to [facility] on [address] and then also a RX sent to [facility]. I will discuss with Dr. Juanda Chance and call the pt with the medication change. The pt is agreeable. Sherri Rad, RN, BSN  Nov 13, 2009 8:15 AM   I have discussed the above with Dr. Juanda Chance. Orders received to d/c diltiazem - start norvasc 5mg  once daily - & increase lopressor to 50mg  two times a day. Dr. Juanda Chance would like the pt to f/u in 2 weeks with him and have a PPM check done that day to assess his rate response. The pt is aware and agreeable with the above. He verbalizes understanding.   Follow-up by: Sherri Rad, RN, BSN,  Nov 13, 2009 9:11 AM    New/Updated Medications: LOPRESSOR 50 MG TABS (METOPROLOL TARTRATE) take one tab by mouth two times a day AMLODIPINE BESYLATE 5 MG TABS (AMLODIPINE BESYLATE) Take one tablet by mouth daily Prescriptions: AMLODIPINE BESYLATE 5 MG TABS (AMLODIPINE BESYLATE) Take one tablet by mouth daily  #90 x 3   Entered by:   Sherri Rad, RN, BSN   Authorized by:   Lenoria Farrier, MD,  [facility]   Signed by:   Sherri Rad, RN, BSN on 11/13/2009   Method used:   Electronically to        [facility]* (mail-order)             ,          Ph: [phone]       Fax: [phone]   RxID:   [id number] LOPRESSOR 50 MG TABS (METOPROLOL TARTRATE) take one tab by mouth two times a day  #180 x 3   Entered by:   Sherri Rad, RN, BSN   Authorized by:   Lenoria Farrier, MD, [facility]   Signed by:   Sherri Rad, RN, BSN on 11/13/2009   Method used:   Electronically to        [facility]* (mail-order)             ,          Ph: [phone]       Fax: [phone]   RxID:   [id number]

## 2010-08-14 NOTE — Letter (Signed)
Summary: [facility] Patient Information   Patient Information   Imported By: Roderic Ovens 01/04/2010 09:41:23  _____________________________________________________________________  External Attachment:    Type:   Image     Comment:   External Document

## 2010-08-14 NOTE — Assessment & Plan Note (Signed)
Summary: f40m      Allergies Added:   Primary Provider:  Norins   History of Present Illness: Mr. Spencer Jones is 77 years old and return for management of CAD, atrial fibrillation, and his pacemaker. He had bypass surgery in 1991. He has chronic atrial fibrillation. He has a St. Jude pacemaker that is programmed to [employer]. He is had no recent symptoms of chest pain shortness of breath or palpitations.  His main medical problem has problems with memory which has been recent and progressive. He has seeing Dr Sandria Manly for this.  His other problems include hypertension, hyperlipidemia, chronic renal deficiency, and a history of prostate cancer.   Problems Prior to Update: 1)  Memory Loss  (ICD-780.93) 2)  Pacemaker, Permanent  (ICD-V45.01) 3)  Rectal Bleeding  (ICD-569.3) 4)  Contusion of Buttock  (ICD-922.32) 5)  Aodm  (ICD-250.00) 6)  Other and Unspecified Hyperlipidemia  (ICD-272.4) 7)  Hypertension  (ICD-401.9) 8)  Gout  (ICD-274.9) 9)  Coronary Artery Disease  (ICD-414.00) 10)  Prostate Cancer, Hx of  (ICD-V10.46) 11)  Atrial Fibrillation  (ICD-427.31)  Current Problems (verified): 1)  Memory Loss  (ICD-780.93) 2)  Pacemaker, Permanent  (ICD-V45.01) 3)  Rectal Bleeding  (ICD-569.3) 4)  Contusion of Buttock  (ICD-922.32) 5)  Aodm  (ICD-250.00) 6)  Other and Unspecified Hyperlipidemia  (ICD-272.4) 7)  Hypertension  (ICD-401.9) 8)  Gout  (ICD-274.9) 9)  Coronary Artery Disease  (ICD-414.00) 10)  Prostate Cancer, Hx of  (ICD-V10.46) 11)  Atrial Fibrillation  (ICD-427.31)  Current Medications (verified): 1)  Engage Study Drug .... Take As Directed 2)  Lipitor 20 Mg Tabs (Atorvastatin Calcium) .... Take 1/2 Tab By Mouth At Bedtime 3)  Lotensin 40 Mg Tabs (Benazepril Hcl) .... Take 1/2 Tablet By Mouth Once A Day 4)  Lopressor 50 Mg Tabs (Metoprolol Tartrate) .... Take One Tab By Mouth Two Times A Day 5)  Viagra 100 Mg Tabs (Sildenafil Citrate) .... Use As Directed 6)  Aspirin 81 Mg   Tabs (Aspirin) .... Take 1 Tablet By Mouth Once A Day 7)  Amlodipine Besylate 5 Mg Tabs (Amlodipine Besylate) .... Take One Tablet By Mouth Daily 8)  Flomax 0.4 Mg Xr24h-Cap (Tamsulosin Hcl) .... Take 1 Tablet By Mouth Once A Day 9)  Nitrostat 0.4 Mg Subl (Nitroglycerin) .... As Needed  Allergies (verified): 1)  Amoxicillin (Amoxicillin) 2)  Sulfamethoxazole (Sulfamethoxazole) 3)  Tetanus Toxoid Adsorbed (Tetanus Toxoid Adsorbed)  Past History:  Past Surgical History: Last updated: 07/13/2008 Colonoscopy-08/19/2001 Coronary artery bypass graft- 5 vessel  1992 Permanent pacemaker-DDD 1999, replacement in 2007 Tonsillectomy Inguinal herniorrhaphy-2004 Sebaceous cyst excision - 2005, 2008 Spermatocele repair '06  Past Medical History: Reviewed history from 02/11/2009 and no changes required.  Physician Roster:     Cardiology - Dr. Juanda Chance     GU- Dr. Etta Grandchild 1. Coronary artery status post coronary bypass graft surgery in 1991     with negative Myoview scan in January 2009. 2. Atrial fibrillation which has become chronic. 3. Status post dual-mode, dual-pacing, dual-sensing pacemaker     implantation with a generator change with a [employer]. Jude Victory dual-     mode, dual-pacing, dual-sensing pacemaker in December 2007. 4. Hypertension, not quite in optimal control. 5. Hyperlipidemia. 6. History of prostate cancer treated with radiation therapy. 7. Moderate renal insufficiency.  8. Gout  Review of Systems       ROS is negative except as outlined in HPI.   Vital Signs:  Patient profile:   77 year old  male Height:      73 inches Weight:      200.25 pounds Pulse rate:   50 / minute BP sitting:   137 / 85  (right arm)  Vitals Entered By: Claris Gladden RN (May 17, 2010 3:27 PM)  Physical Exam  Additional Exam:  Gen. Well-nourished, in no distress   Neck: No JVD, thyroid not enlarged, no carotid bruits Lungs: No tachypnea, clear without rales, rhonchi or  wheezes Cardiovascular: Rhythm irregular, PMI not displaced,  heart sounds  normal, no murmurs or gallops, no peripheral edema, pulses normal in all 4 extremities. Abdomen: BS normal, abdomen soft and non-tender without masses or organomegaly, no hepatosplenomegaly. MS: No deformities, no cyanosis or clubbing   Neuro:  No focal sns   Skin:  no lesions    PPM Specifications Following MD:  Everardo Beals. Juanda Chance, MD     PPM Vendor:  [employer] Jude     PPM Model Number:  [id number]     PPM Serial Number:  [id number] DOI:  07/10/2006     PPM Implanting MD:  Everardo Beals. Juanda Chance, MD  Lead 1    Location: [facility]     DOI: 08/15/1997     Model #: [id number]     Serial #: [id number]     Status: active Lead 2    Location: [facility]     DOI: 08/15/1997     Model #: [id number]     Serial #: [id number]     Status: active  Magnet Response Rate:  BOL 98.6 ERI  86.3  Indications:  SSS, AFIB, ON COUMADIN THERAPY   PPM Follow Up Remote Check?  No Battery Voltage:  2.79 V     Battery Est. Longevity:  8.25 years     Pacer Dependent:  No     Right Ventricle  Amplitude: 4.5 mV, Impedance: 273 ohms, Threshold: 1.125 V at 0.8 msec  Episodes Coumadin:  Yes Ventricular Pacing:  47%  Parameters Mode:  VVIR     Lower Rate Limit:  50     Upper Rate Limit:  110 Paced AV Delay:  250     Tech Comments:  No parameter changes.  Checked by [employer]. [facility] 6 months clinic. Altha Harm, LPN  May 17, 2010 4:09 PM   Impression & Recommendations:  Problem # 1:  CORONARY ARTERY DISEASE (ICD-414.00)  He had bypass surgery in 1991. He has had no chest pain and this problem appears stable. His updated medication list for this problem includes:    Lotensin 40 Mg Tabs (Benazepril hcl) .Marland Kitchen... Take 1/2 tablet by mouth once a day    Lopressor 50 Mg Tabs (Metoprolol tartrate) .Marland Kitchen... Take one tab by mouth two times a day    Aspirin 81 Mg Tabs (Aspirin) .Marland Kitchen... Take 1 tablet by mouth once a day    Amlodipine Besylate 5 Mg Tabs (Amlodipine besylate) .Marland Kitchen... Take one tablet by  mouth daily    Nitrostat 0.4 Mg Subl (Nitroglycerin) .Marland Kitchen... As needed  Orders: EKG w/ Interpretation (93000)  Problem # 2:  PACEMAKER, PERMANENT (ICD-V45.01) He has a [employer]. Jude pacemaker programmed to [employer]. He is pacing about half the time. He has good thresholds.  Problem # 3:  ATRIAL FIBRILLATION (ICD-427.31) He has chronic atrial fibrillation managed with rate control. He is on the Engage study drug. His updated medication list for this problem includes:    Lopressor 50 Mg Tabs (Metoprolol tartrate) .Marland Kitchen... Take one tab by mouth two times a  day    Aspirin 81 Mg Tabs (Aspirin) .Marland Kitchen... Take 1 tablet by mouth once a day  Patient Instructions: 1)  Your physician wants you to follow-up in: 6 months with Dr. Shirlee Latch and 1 year with Dr. Johney Frame for pacemaker followup. You will receive a reminder letter in the mail two months in advance. If you don't receive a letter, please call our office to schedule the follow-up appointment. 2)  Your physician recommends that you continue on your current medications as directed. Please refer to the Current Medication list given to you today.

## 2010-08-14 NOTE — Letter (Signed)
[facility]   Imported By: Marylou Mccoy 04/16/2010 08:01:58  _____________________________________________________________________  External Attachment:    Type:   Image     Comment:   External Document

## 2010-08-14 NOTE — Cardiovascular Report (Signed)
Summary: Office Visit   Office Visit   Imported By: Roderic Ovens 08/07/2009 13:21:40  _____________________________________________________________________  External Attachment:    Type:   Image     Comment:   External Document

## 2010-08-14 NOTE — Consult Note (Signed)
Summary: Avie Echevaria MD  Avie Echevaria MD   Imported By: Lester Redford 02/27/2010 08:26:47  _____________________________________________________________________  External Attachment:    Type:   Image     Comment:   External Document

## 2010-08-14 NOTE — Letter (Signed)
Summary: [facility]  [facility]   Imported By: Lester Scott 10/04/2009 08:12:46  _____________________________________________________________________  External Attachment:    Type:   Image     Comment:   External Document

## 2010-08-14 NOTE — Assessment & Plan Note (Signed)
Summary: YEARLY FU / MEDICARE/ LABS AFTER Spencer Jones   Vital Signs:  Patient profile:   77 year old male Height:      73 inches Weight:      193 pounds BMI:     25.56 O2 Sat:      97 % on Room air Temp:     97.1 degrees F oral Pulse rate:   56 / minute BP sitting:   138 / 88  (left arm) Cuff size:   regular  Vitals Entered By: Spencer Jones (October 02, 2009 10:07 AM)  O2 Flow:  Room air CC: Pt here for cpx, pt has never has a pneumonia vaccine per our records. Pt is allergic to Tetanus and last colonoscopy was 08/19/2001 with Dr Spencer Jones (results= Diverticulosis)/ ab  Vision Screening:      Vision Comments: Last eye exam was fall of 2010 with a normal eye exam per pt  Vision Entered By: Spencer Jones Jones (October 02, 2009 10:14 AM)   Primary Care Provider:  Norins  CC:  Pt here for cpx and pt has never has a pneumonia vaccine per our records. Pt is allergic to Tetanus and last colonoscopy was 08/19/2001 with Dr Spencer Jones (results= Diverticulosis)/ ab.  History of Present Illness: Patient presents for routine medical follow-up. Interval history - no major illness or surgery or injury. He has noticed that his memory is not as good as it used to be. He will forget why he has gone upstairs, problems with name recall. No physical complaints. [facility]  Current Medications (verified): 1)  Engage Study Drug .... Take As Directed 2)  Lipitor 20 Mg Tabs (Atorvastatin Calcium) .... Take 1/2 Tab By Mouth At Bedtime 3)  Lotensin 40 Mg Tabs (Benazepril Hcl) .... Take 1/2 Tablet By Mouth Once A Day 4)  Lopressor 50 Mg Tabs (Metoprolol Tartrate) .Marland Kitchen.. 1 Tab Once Daily 5)  Viagra 100 Mg Tabs (Sildenafil Citrate) .... Use As Directed 6)  Aspirin 81 Mg  Tabs (Aspirin) .... Take 1 Tablet By Mouth Once A Day 7)  Diltiazem Hcl Cr 240 Mg Xr24h-Cap (Diltiazem Hcl) .Marland Kitchen.. 1 Tab Once Daily 8)  Flomax 0.4 Mg Xr24h-Cap (Tamsulosin Hcl) .... Take 1 Tablet By Mouth Once A Day 9)   Celebrex 100 Mg Caps (Celecoxib) .... As Needed 10)  Nitrostat 0.4 Mg Subl (Nitroglycerin) .... As Needed 11)  Fluticasone Propionate 50 Mcg/act Susp (Fluticasone Propionate) .... Prn 12)  Sonata 10 Mg Caps (Zaleplon) .Marland Kitchen.. 1 At Bedtime  Allergies (verified): 1)  Amoxicillin (Amoxicillin) 2)  Sulfamethoxazole (Sulfamethoxazole) 3)  Tetanus Toxoid Adsorbed (Tetanus Toxoid Adsorbed)  Past History:  Past Medical History: Last updated: 02/11/2009  Physician Roster:     Cardiology - Dr. Juanda Chance     [facility]- Dr. Etta Grandchild 1. Coronary artery status post coronary bypass graft surgery in 1991     with negative Myoview scan in January 2009. 2. Atrial fibrillation which has become chronic. 3. Status post dual-mode, dual-pacing, dual-sensing pacemaker     implantation with a generator change with a St. Jude Victory dual-     mode, dual-pacing, dual-sensing pacemaker in December 2007. 4. Hypertension, not quite in optimal control. 5. Hyperlipidemia. 6. History of prostate cancer treated with radiation therapy. 7. Moderate renal insufficiency.  8. 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    Physical Exam  General:  WNWD white male in no distress Head:  Normocephalic and atraumatic without obvious abnormalities. No apparent alopecia or balding. Eyes:  No corneal or conjunctival inflammation noted. EOMI. Perrla. Funduscopic exam benign, without hemorrhages, exudates or papilledema. Vision grossly normal. Ears:  External ear exam shows no significant lesions or deformities.  Otoscopic examination reveals clear canals, tympanic membranes are intact bilaterally without bulging, retraction, inflammation or discharge. Hearing is grossly normal bilaterally. Nose:  External nasal examination shows no deformity or inflammation. Nasal mucosa are pink and moist without lesions or exudates. Mouth:  Oral mucosa and oropharynx without lesions or exudates.  Teeth in good repair. Neck:  No deformities, masses, or tenderness noted.no thyromegaly and no carotid bruits.   Chest Wall:  No deformities, masses, tenderness or gynecomastia noted. Lungs:  Normal respiratory effort, chest expands symmetrically. Lungs are clear to auscultation, no crackles or wheezes. Heart:  Normal rate and regular rhythm. S1 and S2 normal without gallop, murmur, click, rub or other extra sounds. Abdomen:  soft, non-tender, normal bowel sounds, no distention, and no hepatomegaly.   Rectal:  deferred to urology Prostate:  deferred to urology Msk:  normal ROM, no joint tenderness, no joint swelling, no joint warmth, no redness over joints, and no joint deformities.   Pulses:  2+ radial and DP pulses Extremities:  No clubbing, cyanosis, edema, or deformity noted with normal full range of motion of all joints.   Neurologic:  MMSE: 1. day - OK, date - month - ok, year - ok 2. president - ok, gov- close, current events - ok 3. 5 fwd; 5 rev with 1 error, world -ok 4. serial 7's - ok; change - ok; nickles -  slow but ok 5. 3 word recall-0/3 6. naming 4 legged creatures - ok; objects -ok 7. parables - ok 8. judgement - fluent  Skin:  turgor normal, color normal, no rashes, and no ulcerations.   Cervical Nodes:  no anterior cervical adenopathy and no posterior cervical adenopathy.   Psych:  Oriented X3, normally interactive, and good eye contact.  See MMSE - neuro exam   Impression & Recommendations:  Problem # 1:  AODM (ICD-250.00)  His updated medication list for this problem includes:    Lotensin 40 Mg Tabs (Benazepril hcl) .Marland Kitchen... Take 1/2 tablet by mouth once a day    Aspirin 81 Mg Tabs (Aspirin) .Marland Kitchen... Take 1 tablet by mouth once a day  Orders: TLB-A1C / Hgb A1C (Glycohemoglobin) (83036-A1C)  A1C reflects great control with life-style management  Problem # 2:  OTHER AND UNSPECIFIED HYPERLIPIDEMIA (ICD-272.4) LDL 62 - @ goal  His updated medication list for this problem includes:    Lipitor 20 Mg Tabs (Atorvastatin calcium) .Marland Kitchen... Take 1/2 tab by mouth at bedtime  Problem # 3:  HYPERTENSION (ICD-401.9)  His updated medication list for this problem includes:    Lotensin 40 Mg Tabs (Benazepril  hcl) ..... Take 1/2 tablet by mouth once a day    Lopressor 50 Mg Tabs (Metoprolol tartrate) .Marland Kitchen... 1 tab once daily    Diltiazem Hcl Cr 240 Mg Xr24h-cap (Diltiazem hcl) .Marland Kitchen... 1 tab once daily  Orders: TLB-BMP (Basic Metabolic Panel-BMET) (80048-METABOL)  BP today: 138/88 Prior BP: 144/87 (07/17/2009)  Good control. No change in medications  Problem # 4:  GOUT (ICD-274.9) No flares of gout  Problem # 5:  PROSTATE CANCER, HX OF (ICD-V10.46) Stable - follow with Dr. Laverle Patter  Problem # 6:  ATRIAL FIBRILLATION (ICD-427.31) Regular rate on exam today. He follows routinely with cardiology.  His updated medication list for this problem includes:    Lopressor 50 Mg Tabs (Metoprolol tartrate) .Marland Kitchen... 1 tab once daily    Aspirin 81 Mg Tabs (Aspirin) .Marland Kitchen... Take 1 tablet by mouth once a day     Diltiazem Hcl Cr 240 Mg Xr24h-cap (Diltiazem hcl) .Marland Kitchen... 1 tab once daily  Orders: TLB-CBC Platelet - w/Differential (85025-CBCD)  Problem # 7:  Preventive Health Care (ICD-V70.0) normal exam. No significant cognitive deficits. Lab results are fine. He is given pneumovax at today's visit.   In summary - a gentleman who appears to be medically stable. He is encouraged to  consider a move to congregate living. He will return as needed or 1 year.  Complete Medication List: 1)  Engage Study Drug  .... Take as directed 2)  Lipitor 20 Mg Tabs (Atorvastatin calcium) .... Take 1/2 tab by mouth at bedtime 3)  Lotensin 40 Mg Tabs (Benazepril hcl) .... Take 1/2 tablet by mouth once a day 4)  Lopressor 50 Mg Tabs (Metoprolol tartrate) .Marland Kitchen.. 1 tab once daily 5)  Viagra 100 Mg Tabs (Sildenafil citrate) .... Use as directed 6)  Aspirin 81 Mg Tabs (Aspirin) .... Take 1 tablet by mouth once a day 7)  Diltiazem Hcl Cr 240 Mg Xr24h-cap (Diltiazem hcl) .Marland Kitchen.. 1 tab once daily 8)  Flomax 0.4 Mg Xr24h-cap (Tamsulosin hcl) .... Take 1 tablet by mouth once a day 9)  Celebrex 100 Mg Caps (Celecoxib) .... As needed 10)  Nitrostat 0.4 Mg Subl (Nitroglycerin) .... As needed 11)  Fluticasone Propionate 50 Mcg/act Susp (Fluticasone propionate) .... Prn 12)  Sonata 10 Mg Caps (Zaleplon) .Marland Kitchen.. 1 at bedtime  Other Orders: TLB-Lipid Panel (80061-LIPID) TLB-Hepatic/Liver Function Pnl (80076-HEPATIC) Pneumococcal Vaccine (30865) Admin 1st Vaccine (78469)   Patient: Spencer Jones Note: All result statuses are Final unless otherwise noted.  Tests: (1) BMP (METABOL)   Sodium                    141 mEq/L                   135-145   Potassium                 4.3 mEq/L                   3.5-5.1   Chloride                  103 mEq/L                   96-112   Carbon Dioxide            29 mEq/L                    19-32   Glucose  96 mg/dL                    34-74   BUN                       21 mg/dL                     2-59   Creatinine                1.4 mg/dL                   5.6-3.8   Calcium                   9.3 mg/dL                   7.5-64.3   GFR                       51.45 mL/min                >60  Tests: (2) Hemoglobin A1C (A1C)   Hemoglobin A1C            6.1 %                       4.6-6.5     Glycemic Control Guidelines for People with Diabetes:     Non Diabetic:  <6%     Goal of Therapy: <7%     Additional Action Suggested:  >8%   Tests: (3) Lipid Panel (LIPID)   Cholesterol               137 mg/dL                   3-295     ATP III Classification            Desirable:  < 200 mg/dL                    Borderline High:  200 - 239 mg/dL               High:  > = 240 mg/dL   Triglycerides             74.0 mg/dL                  1.8-841.6     Normal:  <150 mg/dL     Borderline High:  606 - 199 mg/dL   HDL                       30.16 mg/dL                 >01.09   VLDL Cholesterol          14.8 mg/dL                  3.2-35.5   LDL Cholesterol           62 mg/dL                    7-32  CHO/HDL Ratio:  CHD Risk                             2  Men          Women     1/2 Average Risk     3.4          3.3     Average Risk          5.0          4.4     2X Average Risk          9.6          7.1     3X Average Risk          15.0          11.0                           Tests: (4) Hepatic/Liver Function Panel (HEPATIC)   Total Bilirubin      [H]  1.7 mg/dL                   2.8-4.1   Direct Bilirubin          0.3 mg/dL                   3.2-4.4   Alkaline Phosphatase      73 U/L                      39-117   AST                       26 U/L                      0-37   ALT                       25 U/L                      0-53   Total Protein             7.5 g/dL                    0.1-0.2   Albumin                   4.4 g/dL                    7.2-5.3  Tests: (5) CBC Platelet w/Diff (CBCD)   White Cell Count          7.3 K/uL                    4.5-10.5    Red Cell Count            5.11 Mil/uL                 4.22-5.81   Hemoglobin                15.8 g/dL                   66.4-40.3   Hematocrit                47.7 %                      39.0-52.0   MCV  93.5 fl                     78.0-100.0   MCHC                      33.2 g/dL                   04.5-40.9   RDW                       14.2 %                      11.5-14.6   Platelet Count       [L]  117.0 K/uL                  150.0-400.0   Neutrophil %              74.0 %                      43.0-77.0   Lymphocyte %              15.1 %                      12.0-46.0   Monocyte %                8.4 %                       3.0-12.0   Eosinophils%              2.4 %                       0.0-5.0   Basophils %               0.1 %                       0.0-3.0   Neutrophill Absolute      5.4 K/uL                    1.4-7.7   Lymphocyte Absolute       1.1 K/uL                    0.7-4.0   Monocyte Absolute         0.6 K/uL                    0.1-1.0  Eosinophils, Absolute                             0.2 K/uL                    0.0-0.7   Basophils Absolute        0.0 K/uL                    0.0-0.1Prescriptions: FLUTICASONE PROPIONATE 50 MCG/ACT SUSP (FLUTICASONE PROPIONATE) prn  #3 x 3   Entered and Authorized by:   Jacques Navy MD   Signed by:   Jacques Navy MD on 10/02/2009   Method used:   Electronically to        [facility]* (mail-order)             ,  Ph: [phone]       Fax: [phone]   RxID:   [id number] FLUTICASONE PROPIONATE 50 MCG/ACT SUSP (FLUTICASONE PROPIONATE) prn  #1 x 0   Entered and Authorized by:   Jacques Navy MD   Signed by:   Jacques Navy MD on 10/02/2009   Method used:   Electronically to        [facility]  [phone]* (retail)       [address]       [place], [place]  [address]       Ph: [phone] or [phone]       Fax: [phone]   RxID:    [id number]    Preventive Care Screening  Colonoscopy:    Date:  08/19/2001    Results:  Diverticulosis     Immunizations Administered:  Pneumonia Vaccine:    Vaccine Type: Pneumovax    Site: left deltoid    Mfr: Merck    Dose: 0.5 ml    Route: IM    Given by: Spencer Jones Jones    Exp. Date: 02/26/2011    Lot #: [id number]    VIS given: 02/10/96 version given October 02, 2009.

## 2010-08-14 NOTE — Letter (Signed)
Summary: [facility]  [facility]   Imported By: Marylou Mccoy 12/20/2009 12:06:32  _____________________________________________________________________  External Attachment:    Type:   Image     Comment:   External Document

## 2010-08-14 NOTE — Progress Notes (Signed)
Summary: talk to nurse  I spoke to Dr. Sandria Manly and explained to him about the study drug.The Medical monitor has not seen any memory loss and peripheral neuropathy. Phone Note From Other Clinic   Caller: Provider Summary of Call: Per Dr Sandria Manly would like either nurse or Dr Juanda Chance to call him concerning this pt. [phone] Initial call taken by: Edman Circle,  December 13, 2009 11:29 AM  Follow-up for Phone Call        I called and spoke with Dr. Sandria Manly. He states he just saw the pt and needs to know what study drug he is taking and if double blinded, he needs to know if there is a risk for memory loss on the drug. He also has a peripheral neuropathy. I will discuss with research and call him back. Follow-up by: Sherri Rad, RN, BSN,  December 13, 2009 12:00 PM  Additional Follow-up for Phone Call Additional follow up Details #1::        I spoke with Lennie Odor for research. She will probably need to call the medical monitor for the ENGAGE to see about side effects. She will call Dr. Sandria Manly back. I will forward the message to her desktop. Sherri Rad, RN, BSN  December 13, 2009 12:17 PM

## 2010-08-14 NOTE — Progress Notes (Signed)
    I received phone call on 10/24/09.Patient had been to the dentist. Dr. Talbert Cage was concerned about the patient's gums and deterioration of gums in last 6 months. The patient asked [place] to send information about the Engage Study to the dentist which I did. I contacted the [facility] Hotline for the Study and spoke to Dr. Mayford Knife. he stated they had not seen the gum issue with the drug. I spoke to Weston Brass to review patient's meds and she did not see any drugs that would cause gum hyperplasia. Pt was set up appointment on 11/01/09 to see Dr. Juanda Chance for relation of gums to the Engage Study Drug. Pt will see Dr. Juluis Pitch on 11/07/09. for gum hyperplasia. Patient was notified of upcoming appointment .

## 2010-08-14 NOTE — Assessment & Plan Note (Signed)
Summary: ROV  Medications Added LOPRESSOR 50 MG TABS (METOPROLOL TARTRATE) 1 tab once daily DILTIAZEM HCL CR 240 MG XR24H-CAP (DILTIAZEM HCL) 1 tab once daily FLUTICASONE PROPIONATE 50 MCG/ACT SUSP (FLUTICASONE PROPIONATE) prn      Allergies Added:   Visit Type:  Follow-up Primary Provider:  Norins  CC:  no complaints.  History of Present Illness: The patient is 77 years old and return for management of CAD and his pacemaker. He had bypass surgery in 1991. He has sick sinus syndrome and now chronic atrial fibrillation and has a St. Jude pacemaker and with a generator change in 2007. He is also in the ENGAGE trial.  I saw him in August with a mild rectal bleeding but we didn't have to stop his study drug and this resolved.  He says he been doing quite well with no chest pain short of breath or palpitations.  His past medical history significant for hypertension hyperlipidemia and prostate cancer treated.  His pacemaker is programmed to DDD IR mode so we can't tell if his atrial fibrillation is chronic but we believe it is  Problems Prior to Update: 1)  Pacemaker  (ICD-V45.Marland Kitchen01) 2)  Rectal Bleeding  (ICD-569.3) 3)  Contusion of Buttock  (ICD-922.32) 4)  Aodm  (ICD-250.00) 5)  Other and Unspecified Hyperlipidemia  (ICD-272.4) 6)  Hypertension  (ICD-401.9) 7)  Gout  (ICD-274.9) 8)  Coronary Artery Disease  (ICD-414.00) 9)  Prostate Cancer, Hx of  (ICD-V10.46) 10)  Atrial Fibrillation  (ICD-427.31)  Current Medications (verified): 1)  Engage Study Drug .... Take As Directed 2)  Lipitor 20 Mg Tabs (Atorvastatin Calcium) .... Take 1/2 Tab By Mouth At Bedtime 3)  Lotensin 40 Mg Tabs (Benazepril Hcl) .... Take 1/2 Tablet By Mouth Once A Day 4)  Lopressor 50 Mg Tabs (Metoprolol Tartrate) .Marland Kitchen.. 1 Tab Once Daily 5)  Viagra 100 Mg Tabs (Sildenafil Citrate) .... Use As Directed 6)  Aspirin 81 Mg  Tabs (Aspirin) .... Take 1 Tablet By Mouth Once A Day 7)  Diltiazem Hcl Cr 240 Mg Xr24h-Cap  (Diltiazem Hcl) .Marland Kitchen.. 1 Tab Once Daily 8)  Flomax 0.4 Mg Xr24h-Cap (Tamsulosin Hcl) .... Take 1 Tablet By Mouth Once A Day 9)  Celebrex 100 Mg Caps (Celecoxib) .... As Needed 10)  Nitrostat 0.4 Mg Subl (Nitroglycerin) .... As Needed 11)  Fluticasone Propionate 50 Mcg/act Susp (Fluticasone Propionate) .... Prn 12)  Sonata 10 Mg Caps (Zaleplon) .Marland Kitchen.. 1 At Bedtime  Allergies (verified): 1)  Amoxicillin (Amoxicillin) 2)  Sulfamethoxazole (Sulfamethoxazole) 3)  Tetanus Toxoid Adsorbed (Tetanus Toxoid Adsorbed)  Past History:  Past Medical History: Reviewed history from 02/11/2009 and no changes required.  Physician Roster:     Cardiology - Dr. Juanda Chance     [facility]- Dr. Etta Grandchild 1. Coronary artery status post coronary bypass graft surgery in 1991     with negative Myoview scan in January 2009. 2. Atrial fibrillation which has become chronic. 3. Status post dual-mode, dual-pacing, dual-sensing pacemaker     implantation with a generator change with a St. Jude Victory dual-     mode, dual-pacing, dual-sensing pacemaker in December 2007. 4. Hypertension, not quite in optimal control. 5. Hyperlipidemia. 6. History of prostate cancer treated with radiation therapy. 7. Moderate renal insufficiency.  8. Gout  Review of Systems       ROS is negative except as outlined in HPI.   Vital Signs:  Patient profile:   77 year old male Height:      73 inches Weight:  203 pounds BMI:     26.88 Pulse rate:   50 / minute BP sitting:   144 / 87  (left arm) Cuff size:   large  Vitals Entered By: Burnett Kanaris, CNA (July 17, 2009 9:41 AM)  Physical Exam  Additional Exam:  Gen. Well-nourished, in no distress   Neck: No JVD, thyroid not enlarged, no carotid bruits Lungs: No tachypnea, clear without rales, rhonchi or wheezes Cardiovascular: Rhythm regular, PMI not displaced,  heart sounds  normal, no murmurs or gallops, no peripheral edema, pulses normal in all 4 extremities. Abdomen: BS normal,  abdomen soft and non-tender without masses or organomegaly, no hepatosplenomegaly. MS: No deformities, no cyanosis or clubbing   Neuro:  No focal sns   Skin:  no lesions    PPM Specifications Following MD:  Everardo Beals. Juanda Chance, MD     PPM Vendor:  St Jude     PPM Model Number:  [id number]     PPM Serial Number:  [id number] PPM DOI:  07/10/2006     PPM Implanting MD:  Everardo Beals. Juanda Chance, MD  Lead 1    Location: RA     DOI: 08/15/1997     Model #: [id number]     Serial #: [id number]     Status: active Lead 2    Location: RV     DOI: 08/15/1997     Model #: [id number]     Serial #: [id number]     Status: active   Indications:  SSS, AFIB, ON COUMADIN THERAPY   PPM Follow Up Remote Check?  No Battery Voltage:  2.79 V     Battery Est. Longevity:  5 YEARS     Pacer Dependent:  No       PPM Device Measurements Atrium  Amplitude: 2.9 mV, Impedance: 349 ohms,  Right Ventricle  Amplitude: 5.3 mV, Impedance: 367 ohms, Threshold: 1.5 V at 0.8 msec  Episodes Coumadin:  Yes Ventricular High Rate:  NOT AVAILABLE     Atrial Pacing:  <1%     Ventricular Pacing:  62%  Parameters Mode:  DDIR     Lower Rate Limit:  50     Upper Rate Limit:  110 Paced AV Delay:  250     Next Cardiology Appt Due:  01/12/2010 Tech Comments:  Normal device function.  RV output increased to 3V at 0.32msec today to allow for safety margin.  No other changes made.  Pt does not do TTM's.  ROV 6 months BB. Gypsy Balsam RN BSN  July 17, 2009 9:40 AM   Impression & Recommendations:  Problem # 1:  CORONARY ARTERY DISEASE (ICD-414.00) He had CABG 1991.  No CP. this problem is stable. His updated medication list for this problem includes:    Lotensin 40 Mg Tabs (Benazepril hcl) .Marland Kitchen... Take 1/2 tablet by mouth once a day    Lopressor 50 Mg Tabs (Metoprolol tartrate) .Marland Kitchen... 1 tab once daily    Aspirin 81 Mg Tabs (Aspirin) .Marland Kitchen... Take 1 tablet by mouth once a day    Diltiazem Hcl Cr 240 Mg Xr24h-cap (Diltiazem hcl) .Marland Kitchen... 1 tab once daily    Nitrostat 0.4 Mg  Subl (Nitroglycerin) .Marland Kitchen... As needed  Problem # 2:  ATRIAL FIBRILLATION (ICD-427.31) He had chronic AF.  Rate control is good. His updated medication list for this problem includes:    Lopressor 50 Mg Tabs (Metoprolol tartrate) .Marland Kitchen... 1 tab once daily    Aspirin 81 Mg Tabs (Aspirin) .Marland Kitchen... Take  1 tablet by mouth once a day  Problem # 3:  PACEMAKER (ICD-V45.Marland Kitchen01) He is programmed to DDIR.  Will reprogram to VVIR.  Parameters good.  Problem # 4:  HYPERTENSION (ICD-401.9) Controlled on current meds. His updated medication list for this problem includes:    Lotensin 40 Mg Tabs (Benazepril hcl) .Marland Kitchen... Take 1/2 tablet by mouth once a day    Lopressor 50 Mg Tabs (Metoprolol tartrate) .Marland Kitchen... 1 tab once daily    Aspirin 81 Mg Tabs (Aspirin) .Marland Kitchen... Take 1 tablet by mouth once a day    Diltiazem Hcl Cr 240 Mg Xr24h-cap (Diltiazem hcl) .Marland Kitchen... 1 tab once daily  Patient Instructions: 1)  Your physician wants you to follow-up in: 6 months.  You will receive a reminder letter in the mail two months in advance. If you don't receive a letter, please call our office to schedule the follow-up appointment.

## 2010-08-14 NOTE — Progress Notes (Signed)
Summary: Med List  Med List   Imported By: Roderic Ovens 08/08/2009 11:19:11  _____________________________________________________________________  External Attachment:    Type:   Image     Comment:   External Document

## 2010-08-14 NOTE — Miscellaneous (Signed)
Summary: dx correction   Clinical Lists Changes  Problems: Changed problem from PACEMAKER (ICD-V45.Marland Kitchen01) to PACEMAKER, PERMANENT (ICD-V45.01)   changed the incorrect dx code to correct dx code Genella Mech  May 11, 2010 2:09 PM

## 2010-08-14 NOTE — Assessment & Plan Note (Signed)
Summary: rov      Allergies Added:   Visit Type:  Follow-up Primary Provider:  Norins  CC:  device check today-no complaints.  History of Present Illness: The patient is 77 years old and returns today for followup after recent medication change. He had bypass surgery 1991. He has chronic atrial fibrillation and he has a St. Jude pacemaker with a generator change in 2007. He is in the in gait steady.  Recently he developed some gum hypertrophy which is dentist thought might be related to diltiazem. This reason we stopped this and put him on amlodipine in its place for blood pressure. I was concerned that his ventricular rate might become less controlled without the diltiazem and brought him back in for pacemaker interrogation and a recheck.  His other problems include hypertension hyperlipidemia and chronic renal insufficiency. He also has had some memory difficulty and is scheduled to see Dr. love about this next month.  Current Medications (verified): 1)  Engage Study Drug .... Take As Directed 2)  Lipitor 20 Mg Tabs (Atorvastatin Calcium) .... Take 1/2 Tab By Mouth At Bedtime 3)  Lotensin 40 Mg Tabs (Benazepril Hcl) .... Take 1/2 Tablet By Mouth Once A Day 4)  Lopressor 50 Mg Tabs (Metoprolol Tartrate) .... Take One Tab By Mouth Two Times A Day 5)  Viagra 100 Mg Tabs (Sildenafil Citrate) .... Use As Directed 6)  Aspirin 81 Mg  Tabs (Aspirin) .... Take 1 Tablet By Mouth Once A Day 7)  Amlodipine Besylate 5 Mg Tabs (Amlodipine Besylate) .... Take One Tablet By Mouth Daily 8)  Flomax 0.4 Mg Xr24h-Cap (Tamsulosin Hcl) .... Take 1 Tablet By Mouth Once A Day 9)  Celebrex 100 Mg Caps (Celecoxib) .... As Needed 10)  Nitrostat 0.4 Mg Subl (Nitroglycerin) .... As Needed 11)  Fluticasone Propionate 50 Mcg/act Susp (Fluticasone Propionate) .... Prn 12)  Sonata 10 Mg Caps (Zaleplon) .Marland Kitchen.. 1 At Bedtime  Allergies (verified): 1)  Amoxicillin (Amoxicillin) 2)  Sulfamethoxazole  (Sulfamethoxazole) 3)  Tetanus Toxoid Adsorbed (Tetanus Toxoid Adsorbed)  Past History:  Past Medical History: Reviewed history from 02/11/2009 and no changes required.  Physician Roster:     Cardiology - Dr. Juanda Chance     GU- Dr. Etta Grandchild 1. Coronary artery status post coronary bypass graft surgery in 1991     with negative Myoview scan in January 2009. 2. Atrial fibrillation which has become chronic. 3. Status post dual-mode, dual-pacing, dual-sensing pacemaker     implantation with a generator change with a St. Jude Victory dual-     mode, dual-pacing, dual-sensing pacemaker in December 2007. 4. Hypertension, not quite in optimal control. 5. Hyperlipidemia. 6. History of prostate cancer treated with radiation therapy. 7. Moderate renal insufficiency.  8. Gout  Review of Systems       ROS is negative except as outlined in HPI.   Vital Signs:  Patient profile:   77 year old male Height:      73 inches Weight:      196 pounds Pulse rate:   50 / minute BP sitting:   123 / 80  (left arm) Cuff size:   regular  Vitals Entered By: Burnett Kanaris, CNA (Nov 28, 2009 3:24 PM)  Physical Exam  Additional Exam:  Gen. Well-nourished, in no distress   Neck: No JVD, thyroid not enlarged, no carotid bruits Lungs: No tachypnea, clear without rales, rhonchi or wheezes Cardiovascular: Rhythm irregular, PMI not displaced,  heart sounds  normal, no murmurs or gallops, no peripheral  edema, pulses normal in all 4 extremities. Abdomen: BS normal, abdomen soft and non-tender without masses or organomegaly, no hepatosplenomegaly. MS: No deformities, no cyanosis or clubbing   Neuro:  No focal sns   Skin:  no lesions    PPM Specifications Following MD:  Everardo Beals. Juanda Chance, MD     PPM Vendor:  St Jude     PPM Model Number:  [id number]     PPM Serial Number:  [id number] PPM DOI:  07/10/2006     PPM Implanting MD:  Everardo Beals. Juanda Chance, MD  Lead 1    Location: RA     DOI: 08/15/1997     Model #: [id number]     Serial #:  [id number]     Status: active Lead 2    Location: RV     DOI: 08/15/1997     Model #: [id number]     Serial #: [id number]     Status: active  Magnet Response Rate:  BOL 98.6 ERI  86.3  Indications:  SSS, AFIB, ON COUMADIN THERAPY   PPM Follow Up Remote Check?  No Battery Voltage:  2.80 V     Battery Est. Longevity:  7.5 years     Pacer Dependent:  No     Right Ventricle  Amplitude: 5.1 mV, Impedance: 288 ohms, Threshold: 1.375 V at 0.8 msec  Episodes Coumadin:  Yes  Parameters Mode:  VVIR     Lower Rate Limit:  50     Upper Rate Limit:  110 Paced AV Delay:  250     Tech Comments:  Auto capture programmed on.  Changed to unipolar ring sense.  Rate response adequate for the patient's level of activity.  ROV 6 months. Altha Harm, LPN  Nov 28, 2009 3:47 PM   Impression & Recommendations:  Problem # 1:  ATRIAL FIBRILLATION (ICD-427.31) He has chronic atrial fibrillation. We discontinued his diltiazem last visit because of potential side effects with gum hypertrophy. We put him on Norvasc instead. We interrogated his pacemaker today and he had no fast intrinsic rates. His rate response was determined primarily by his pacemaker. His updated medication list for this problem includes:    Lopressor 50 Mg Tabs (Metoprolol tartrate) .Marland Kitchen... Take one tab by mouth two times a day    Aspirin 81 Mg Tabs (Aspirin) .Marland Kitchen... Take 1 tablet by mouth once a day  Problem # 2:  PACEMAKER (ICD-V45.Marland Kitchen01) We interrogated the pacemaker as above. It is now programmed to VVI.  Problem # 3:  CORONARY ARTERY DISEASE (ICD-414.00) He had remote bypass surgery. He's had no recent chest pain. This appears stable. His updated medication list for this problem includes:    Lotensin 40 Mg Tabs (Benazepril hcl) .Marland Kitchen... Take 1/2 tablet by mouth once a day    Lopressor 50 Mg Tabs (Metoprolol tartrate) .Marland Kitchen... Take one tab by mouth two times a day    Aspirin 81 Mg Tabs (Aspirin) .Marland Kitchen... Take 1 tablet by mouth once a day    Amlodipine  Besylate 5 Mg Tabs (Amlodipine besylate) .Marland Kitchen... Take one tablet by mouth daily    Nitrostat 0.4 Mg Subl (Nitroglycerin) .Marland Kitchen... As needed  Patient Instructions: 1)  Your physician wants you to follow-up in:  6 months. You will receive a reminder letter in the mail two months in advance. If you don't receive a letter, please call our office to schedule the follow-up appointment.

## 2010-08-14 NOTE — Progress Notes (Signed)
Summary: needs dosage of medication refill done daj   Phone Note Call from Patient Call back at Home Phone [phone]   Caller: Patient Summary of Call: Please call regarding medication...needs to know what dosage he is to take.   Initial call taken by: Burnard Leigh,  July 18, 2009 8:48 AM  Follow-up for Phone Call        refill done daj Follow-up by: Burnett Kanaris, CNA,  July 21, 2009 2:50 PM    Prescriptions: LOPRESSOR 50 MG TABS (METOPROLOL TARTRATE) 1 tab once daily  #90 x 3   Entered by:   Burnett Kanaris, CNA   Authorized by:   Lenoria Farrier, MD, [facility]   Signed by:   Burnett Kanaris, CNA on 07/21/2009   Method used:   Faxed to ...       [facility] (mail-order)             ,          Ph: [phone]       Fax: [phone]   RxID:   [id number]

## 2010-08-14 NOTE — Assessment & Plan Note (Signed)
Summary: flu shot/men/cd   Nurse Visit   Allergies: 1)  Amoxicillin (Amoxicillin) 2)  Sulfamethoxazole (Sulfamethoxazole) 3)  Tetanus Toxoid Adsorbed (Tetanus Toxoid Adsorbed)  Orders Added: 1)  Flu Vaccine 93yrs + MEDICARE PATIENTS [Q2039] 2)  Administration Flu vaccine - MCR [G0008] Flu Vaccine Consent Questions     Do you have a history of severe allergic reactions to this vaccine? no    Any prior history of allergic reactions to egg and/or gelatin? no    Do you have a sensitivity to the preservative Thimersol? no    Do you have a past history of Guillan-Barre Syndrome? no    Do you currently have an acute febrile illness? no    Have you ever had a severe reaction to latex? no    Vaccine information given and explained to patient? yes    Are you currently pregnant? no    Lot Number:[id number]   Exp Date:01/12/2011   Site Given  Right Deltoid IM .[email or web address]

## 2010-08-14 NOTE — Progress Notes (Signed)
Summary: refill request   Phone Note Refill Request Message from:  Patient on March 22, 2010 2:39 PM  Refills Requested: Medication #1:  LOTENSIN 40 MG TABS Take 1/2 tablet by mouth once a day  Medication #2:  LIPITOR 20 MG TABS Take 1/2 tab by mouth at bedtime [facility]   Method Requested: Telephone to Pharmacy Initial call taken by: Glynda Jaeger,  March 22, 2010 2:39 PM    Prescriptions: LOTENSIN 40 MG TABS (BENAZEPRIL HCL) Take 1/2 tablet by mouth once a day  #45 x 2   Entered by:   Hardin Negus, RMA   Authorized by:   Lenoria Farrier, MD, [facility]   Signed by:   Hardin Negus, RMA on 03/23/2010   Method used:   Faxed to ...       [facility] (mail-order)             , [place]         Ph: [phone]       Fax: [phone]   RxID:   [id number] LIPITOR 20 MG TABS (ATORVASTATIN CALCIUM) Take 1/2 tab by mouth at bedtime  #45 x 2   Entered by:   Hardin Negus, RMA   Authorized by:   Lenoria Farrier, MD, [facility]   Signed by:   Hardin Negus, RMA on 03/23/2010   Method used:   Faxed to ...       [facility] (mail-order)             , [place]         Ph: [phone]       Fax: [phone]   RxID:   [id number]

## 2010-08-16 NOTE — Letter (Signed)
Summary: [facility]  [facility]   Imported By: Lennie Odor 08/01/2010 14:21:05  _____________________________________________________________________  External Attachment:    Type:   Image     Comment:   External Document

## 2010-09-04 ENCOUNTER — Encounter (INDEPENDENT_AMBULATORY_CARE_PROVIDER_SITE_OTHER): Payer: Self-pay

## 2010-09-04 DIAGNOSIS — R0989 Other specified symptoms and signs involving the circulatory and respiratory systems: Secondary | ICD-10-CM

## 2010-09-22 ENCOUNTER — Encounter: Payer: Self-pay | Admitting: Cardiology

## 2010-10-05 ENCOUNTER — Encounter: Payer: Self-pay | Admitting: Cardiology

## 2010-10-05 ENCOUNTER — Ambulatory Visit (INDEPENDENT_AMBULATORY_CARE_PROVIDER_SITE_OTHER): Payer: Medicare Other | Admitting: Cardiology

## 2010-10-05 ENCOUNTER — Encounter (INDEPENDENT_AMBULATORY_CARE_PROVIDER_SITE_OTHER): Payer: Self-pay

## 2010-10-05 DIAGNOSIS — I4891 Unspecified atrial fibrillation: Secondary | ICD-10-CM

## 2010-10-05 DIAGNOSIS — I2510 Atherosclerotic heart disease of native coronary artery without angina pectoris: Secondary | ICD-10-CM

## 2010-10-05 DIAGNOSIS — I10 Essential (primary) hypertension: Secondary | ICD-10-CM

## 2010-10-05 DIAGNOSIS — R0989 Other specified symptoms and signs involving the circulatory and respiratory systems: Secondary | ICD-10-CM

## 2010-10-05 DIAGNOSIS — Z79899 Other long term (current) drug therapy: Secondary | ICD-10-CM

## 2010-10-05 DIAGNOSIS — Z950 Presence of cardiac pacemaker: Secondary | ICD-10-CM

## 2010-10-05 DIAGNOSIS — E785 Hyperlipidemia, unspecified: Secondary | ICD-10-CM

## 2010-10-05 MED ORDER — WARFARIN SODIUM 5 MG PO TABS
5.0000 mg | ORAL_TABLET | Freq: Every day | ORAL | Status: DC
Start: 2010-10-05 — End: 2010-12-05

## 2010-10-05 NOTE — Patient Instructions (Addendum)
Your physician has requested that you have an echocardiogram. Echocardiography is a painless test that uses sound waves to create images of your heart. It provides your doctor with information about the size and shape of your heart and how well your heart's chambers and valves are working. This procedure takes approximately one hour. There are no restrictions for this procedure.  Dx: CAD  Your physician recommends that you return for lab work same day as Echocardiogram.  Lipid panel will be done at this time   Your physician recommends that you schedule a follow-up appointment in: 6 months   Your physician has recommended you make the following change in your medication: Decrease Amlodipine to 2.5 mg every day  Patient will discontinue study medications and start Coumadin.  Take Coumadin 5mg  once daily starting on Saturday along with the transition kit from the study.    Appt to recheck INR on 3/27.

## 2010-10-07 NOTE — Progress Notes (Signed)
77 yo with history of CAD s/p CABG, chronic atrial fibrillation, pacemaker, CKD, and dementia presents for cardiology followup.  Patient has seen Dr. Juanda Chance in the past and sees me for the first time today.  He has been doing well symptomatically.  He exercises 3 days a week and walks a fair amount daily.  No exertional dyspnea or chest pain.  No syncope/presyncope.  He continues to have short-term memory difficulty.  He and his wife have moved to [place].  He continues in permanent atrial fibrillation.    ECG (1/12): atrial fibrillation with left axis deviation, anterolateral T wave inversions.   Labs (3/11): LDL 62, HDL 60 Labs (10/11): creatinine 1.44 Labs (1/12): creatinine 1.46, LFTs normal  PMH: 1. CAD: CABG 1992.  Myoview in 1/09 with no ischemia.  2. Atrial fibrillation: Permanent. Patient is taking the Engage study drug.  3. Symptomatic bradycardia with St. ude pacemaker (dual chamber).  4. Dementia 5. BPH 6. Nephrolithiasis 7. HTN 8. Hyperlipidemia 9. CKD 10. Prostate cancer: Treated with radiation in 1991.  11. Gout  SH: Lives with wife at [place] in [place].  2 sons, 4 daughters.  Retired.  Rare ETOH.  Quit tobacco 1976.   FH: Heart disease.  Mother with dementia.   ROS: All systems reviewed and negative except as per HPI.   Current Outpatient Prescriptions  Medication Sig Dispense Refill  . amLODipine (NORVASC) 5 MG tablet Take 5 mg by mouth daily. Take 1/2 tablet qd      . aspirin 81 MG tablet Take 81 mg by mouth daily.        Marland Kitchen atorvastatin (LIPITOR) 20 MG tablet Take 10 mg by mouth daily. 1/2 tablet daily       . benazepril (LOTENSIN) 40 MG tablet 1/2 tab po qd       . INVESTIGATIONAL DRUG SIMPLE RECORD        . metoprolol (LOPRESSOR) 50 MG tablet Take 50 mg by mouth 2 (two) times daily.        . nitroGLYCERIN (NITROSTAT) 0.4 MG SL tablet Place 0.4 mg under the tongue every 5 (five) minutes as needed.        . sildenafil (VIAGRA) 100 MG tablet Take 100 mg by  mouth as directed.        . warfarin (COUMADIN) 5 MG tablet Take 1 tablet (5 mg total) by mouth daily.  30 tablet  1    BP 100/80  Pulse 50  Resp 14  Ht 6\' 1"  (1.854 m)  Wt 199 lb (90.266 kg)  BMI 26.25 kg/m2 General: NAD Neck: No JVD, no thyromegaly or thyroid nodule.  Lungs: Clear to auscultation bilaterally with normal respiratory effort. CV: Nondisplaced PMI.  Heart irregular S1/S2, no S3/S4, no murmur.  1+ ankle edema.  No carotid bruit.  Normal pedal pulses.  Abdomen: Soft, nontender, no hepatosplenomegaly, no distention.  Skin: Intact without lesions or rashes.  Neurologic: Alert and oriented x 3.  Psych: Normal affect. Extremities: No clubbing or cyanosis.  HEENT: Normal.

## 2010-10-08 NOTE — Assessment & Plan Note (Signed)
Stable with no ischemic symptoms.  Continue ASA, statin, ACEI, beta blocker.  No recent assessment of LV EF.  I will get an echocardiogram.

## 2010-10-08 NOTE — Assessment & Plan Note (Signed)
Permanent atrial fibrillation.  Good rate control.  He will stop the Engage study drug and start coumadin.

## 2010-10-08 NOTE — Assessment & Plan Note (Signed)
Will get fasting lipids.  Goal LDL < 70.

## 2010-10-08 NOTE — Assessment & Plan Note (Signed)
Blood pressure runs on the low side.  No lightheadedness or orthostatic symptoms.  I will have him decrease his amlodipine to 2.5 mg daily.

## 2010-10-08 NOTE — Assessment & Plan Note (Signed)
Continue regular PCM followup.

## 2010-10-09 ENCOUNTER — Encounter: Payer: Self-pay | Admitting: *Deleted

## 2010-10-10 ENCOUNTER — Ambulatory Visit (INDEPENDENT_AMBULATORY_CARE_PROVIDER_SITE_OTHER): Payer: Medicare Other | Admitting: *Deleted

## 2010-10-10 DIAGNOSIS — Z7901 Long term (current) use of anticoagulants: Secondary | ICD-10-CM

## 2010-10-10 DIAGNOSIS — I4891 Unspecified atrial fibrillation: Secondary | ICD-10-CM

## 2010-10-10 LAB — POCT INR: INR: 3.9

## 2010-10-10 NOTE — Patient Instructions (Signed)
Skip Thursday's dose then change dose to 1/2 pill everyday. Recheck in one week.

## 2010-10-11 ENCOUNTER — Encounter: Payer: Self-pay | Admitting: Cardiology

## 2010-10-18 ENCOUNTER — Other Ambulatory Visit (INDEPENDENT_AMBULATORY_CARE_PROVIDER_SITE_OTHER): Payer: Medicare Other | Admitting: *Deleted

## 2010-10-18 ENCOUNTER — Ambulatory Visit (HOSPITAL_COMMUNITY): Payer: Medicare Other | Attending: Cardiology | Admitting: Radiology

## 2010-10-18 ENCOUNTER — Ambulatory Visit (INDEPENDENT_AMBULATORY_CARE_PROVIDER_SITE_OTHER): Payer: Medicare Other | Admitting: *Deleted

## 2010-10-18 DIAGNOSIS — E785 Hyperlipidemia, unspecified: Secondary | ICD-10-CM

## 2010-10-18 DIAGNOSIS — I4891 Unspecified atrial fibrillation: Secondary | ICD-10-CM

## 2010-10-18 DIAGNOSIS — Z7901 Long term (current) use of anticoagulants: Secondary | ICD-10-CM

## 2010-10-18 DIAGNOSIS — I2510 Atherosclerotic heart disease of native coronary artery without angina pectoris: Secondary | ICD-10-CM | POA: Insufficient documentation

## 2010-10-18 LAB — LIPID PANEL: Cholesterol: 128 mg/dL (ref 0–200)

## 2010-10-18 LAB — POCT INR: INR: 3.7

## 2010-10-18 NOTE — Patient Instructions (Addendum)
INR 3.7  Do NOT take coumadin tomorrow (Friday 10/19/10).  Then start NEW dosing schedule of 1/2 tablet (1.25 mg) every day.  Return to clinic in 1 week.

## 2010-10-25 ENCOUNTER — Encounter: Payer: Medicare Other | Admitting: *Deleted

## 2010-10-25 ENCOUNTER — Ambulatory Visit (INDEPENDENT_AMBULATORY_CARE_PROVIDER_SITE_OTHER): Payer: Medicare Other | Admitting: *Deleted

## 2010-10-25 DIAGNOSIS — I4891 Unspecified atrial fibrillation: Secondary | ICD-10-CM

## 2010-10-25 LAB — POCT INR: INR: 1.4

## 2010-10-25 NOTE — Patient Instructions (Signed)
Today take a whole tablet. Then change your dose to 1 tablet on fridays. And a half tablet all other days. Recheck in 1 week.

## 2010-10-30 ENCOUNTER — Other Ambulatory Visit: Payer: Self-pay | Admitting: Emergency Medicine

## 2010-10-31 ENCOUNTER — Ambulatory Visit (INDEPENDENT_AMBULATORY_CARE_PROVIDER_SITE_OTHER): Payer: Medicare Other | Admitting: *Deleted

## 2010-10-31 DIAGNOSIS — I4891 Unspecified atrial fibrillation: Secondary | ICD-10-CM

## 2010-10-31 LAB — POCT INR: INR: 1.3

## 2010-10-31 MED ORDER — WARFARIN SODIUM 2.5 MG PO TABS: 2.5000 mg | ORAL_TABLET | ORAL | Status: DC

## 2010-11-07 ENCOUNTER — Ambulatory Visit (INDEPENDENT_AMBULATORY_CARE_PROVIDER_SITE_OTHER): Payer: Medicare Other | Admitting: *Deleted

## 2010-11-07 DIAGNOSIS — I4891 Unspecified atrial fibrillation: Secondary | ICD-10-CM

## 2010-11-07 LAB — POCT INR: INR: 1.2

## 2010-11-14 ENCOUNTER — Ambulatory Visit (INDEPENDENT_AMBULATORY_CARE_PROVIDER_SITE_OTHER): Payer: Medicare Other | Admitting: *Deleted

## 2010-11-14 DIAGNOSIS — I4891 Unspecified atrial fibrillation: Secondary | ICD-10-CM

## 2010-11-15 ENCOUNTER — Telehealth: Payer: Self-pay | Admitting: Cardiology

## 2010-11-15 MED ORDER — AMLODIPINE BESYLATE 2.5 MG PO TABS: 2.5000 mg | ORAL_TABLET | Freq: Every day | ORAL | Status: DC

## 2010-11-15 NOTE — Telephone Encounter (Signed)
I talked with pt's wife. Will send Rx for Amlodipine 2.5mg  to [facility].

## 2010-11-15 NOTE — Telephone Encounter (Signed)
Pt needs a new script for amlodipine. Generic for Norvasc.  Please send to [facility]. Please call pt when done.

## 2010-11-20 ENCOUNTER — Ambulatory Visit: Payer: Self-pay | Admitting: Cardiology

## 2010-11-21 ENCOUNTER — Ambulatory Visit (INDEPENDENT_AMBULATORY_CARE_PROVIDER_SITE_OTHER): Payer: Medicare Other | Admitting: *Deleted

## 2010-11-21 DIAGNOSIS — I4891 Unspecified atrial fibrillation: Secondary | ICD-10-CM

## 2010-11-21 LAB — POCT INR: INR: 1.4

## 2010-11-27 NOTE — Assessment & Plan Note (Signed)
[facility]                            CARDIOLOGY OFFICE NOTE   [name], [name]                      MRN:          [id number]  DATE:08/04/2008                            DOB:          March 26, 1925    PRIMARY CARE PHYSICIAN:  Rosalyn Gess. Norins, MD   CLINICAL HISTORY:  Spencer Jones is 77 years old and had bypass surgery in  1991 and has sick sinus syndrome with atrial fibrillation and has become  nearly chronic and has a St. Jude DDD generator change out in 2007.  He  says he has been doing quite well with no chest pain, shortness of  breath, or palpitations.  He recently saw Dr. Debby Bud who did complete  lab work and a complete physical.   His past medical history is significant for hypertension,  hyperlipidemia.  He has had previous prostate cancer treated with  radiation therapy.   His current medications include:  1. Aspirin.  2. Coumadin.  3. Allopurinol.  4. Lipitor 20 mg one-half tablet daily.  5. Flomax.  6. Diltiazem 180 daily.  7. Metoprolol 25 daily.  8. Lotensin 20 mg daily.   On examination, the blood pressure was 136/90 and the pulse 70 and  regular.  There was no venous distension.  The carotid pulses were full  without bruits.  Chest was clear.  Cardiac rhythm was regular.  There  was a short systolic murmur at the left sternal edge.  The abdomen was  soft with normal bowel sounds.  There was no hepatosplenomegaly.  Peripheral pulses were full with no peripheral edema.   Electrocardiogram showed a ventricular-paced rhythm.   IMPRESSION:  1. Coronary artery status post coronary bypass graft surgery in 1991      with negative Myoview scan in January 2009.  2. Atrial fibrillation which has become chronic.  3. Status post dual-mode, dual-pacing, dual-sensing pacemaker      implantation with a generator change with a St. Jude Victory dual-      mode, dual-pacing, dual-sensing pacemaker in December 2007.  4. Hypertension, not  quite in optimal control.  5. Hyperlipidemia.  6. History of prostate cancer treated with radiation therapy.  7. Moderate renal insufficiency.   RECOMMENDATIONS:  [name] is doing well.  On interrogation of his  pacemaker, he is in atrial fibrillation all the time.  We converted him  to DVI.  His rates were somewhat fast.  I will also increase his  diltiazem from 180 to 240 daily.  We will plan to see him back in a  year.  He is a candidate for an Engage trial and we asked Baldo Ash to talk  to him about that.     Bruce Elvera Lennox Juanda Chance, MD, [facility]  Electronically Signed    BRB/MedQ  DD: 08/04/2008  DT: 08/04/2008  Job #: [id number]

## 2010-11-27 NOTE — Assessment & Plan Note (Signed)
[facility]                            CARDIOLOGY OFFICE NOTE   LORING, LISKEY                      MRN:          [id number]  DATE:07/23/2007                            DOB:          1924/11/10    PRIMARY CARE PHYSICIAN:  Dr. Illene Regulus.   PAST MEDICAL HISTORY:  Mr. Troublefield is 77 years old and had coronary  bypass surgery in 1991 and has sick sinus syndrome with paroxysmal  atrial fibrillation and had a St. Jude DDD ONEOK generator change in  December 2007.  He has done quite well since that time.  Has had no  symptoms of chest pain, shortness of breath or palpitations.  He is not  aware of his atrial fibrillation when he goes into it.   PAST MEDICAL HISTORY:  Significant for hypertension, hyperlipidemia.  Previous radiation treatment for prostate cancer.   CURRENT MEDICATIONS:  Include aspirin, Coumadin, allopurinol, Lipitor,  Flomax, Diltiazem, metoprolol, Lotensin.   EXAMINATION:  Today the blood pressure is 134/81, pulse 69 and regular.  There is no venous distension.  The carotid pulses were full without  bruits.  Chest was clear without rales or rhonchi.  The heart rhythm is regular.  I could hear no murmurs or gallops.  ABDOMEN:  Soft with normal bowel sounds.  No hepatosplenomegaly.  The peripheral pulses are full and there is no peripheral edema.   IMPRESSION:  1. Coronary artery disease status post coronary artery bypass grafting      surgery 1991.  2. Sick sinus syndrome with paroxysmal atrial fibrillation.  3. Status post generator change with a St. Jude Victory DDD pacemaker      December, 2007.  4. Hypertension.  5. Hyperlipidemia.  6. A history of prostate cancer status post radiation.  7. Moderate renal insufficiency with creatinine 1.6.   RECOMMENDATIONS:  I think Mr. Digilio is doing well.  We will plan to  interrogate his pacemaker today.  We will have him come in for fasting  laboratory work including a lipid,  liver, CBC, BNP and TSH.  Will plan  to see him back in a year.  Plan to discuss with Mr. Kreiter about doing  a Myoview scan since he is now 18 years post bypass surgery.     Bruce Elvera Lennox Juanda Chance, MD, [facility]  Electronically Signed    BRB/MedQ  DD: 07/23/2007  DT: 07/23/2007  Job #: [id number]

## 2010-11-27 NOTE — Assessment & Plan Note (Signed)
[facility]                            CARDIOLOGY OFFICE NOTE   DEONTA, BOMBERGER                      MRN:          [id number]  DATE:01/29/2007                            DOB:          05-Aug-1924    PRIMARY CARE PHYSICIAN:  Rosalyn Gess. Norins, MD   CLINICAL HISTORY:  Mr. Tavano is 77 years old and has had bypass surgery  in 1991 and has sick sinus syndrome with paroxysmal atrial fibrillation  and recently had a generator change with a new Saint Jude Victory DV  pacemaker implanted in December of 2007. He has done quite well and he  has had no recent chest pain, shortness of breath or palpitations. He  has not been able to feel the atrial fibrillation when he is in it.   PAST MEDICAL HISTORY:  1. Hypertension.  2. Hyperlipidemia.  3. Previous radiation treatment for prostate cancer.   CURRENT MEDICATIONS:  1. Aspirin.  2. Coumadin.  3. Allopurinol.  4. Lipitor.  5. Hydrochlorothiazide.  6. Flomax.  7. Diltiazem.  8. Metoprolol.  9. Lotensin.   PHYSICAL EXAMINATION:  Blood pressure 144/90. Pulse 55 and regular. His  blood pressures at home have been in the range of 115-120/70.  There was no venous distention. The carotid pulses were full and I can  hear no  bruits.  CHEST: Was clear without rales or rhonchi.  CARDIAC: Rhythm was regular. There were no murmurs or gallops.  ABDOMEN: Was soft with normal bowel sounds. There was no  hepatosplenomegaly.  The peripheral pulses were full and there was no peripheral edema.   An electrocardiogram showed sinus bradycardia with diffuse T-wave  changes. There was first-degree block. He was programmed so there was no  pacing.   IMPRESSION:  1. Coronary artery disease, status post coronary artery bypass graft      surgery in 1991, stable.  2. Sick sinus syndrome with paroxysmal atrial fibrillation.  3. Status post recent pacemaker generator change with a Nurse, learning disability DV pacemaker  implanted.  4. Hypertension.  5. Hyperlipidemia.  6. History of prostate cancer.  7. Moderate renal insufficiency and creatinine is 1.6.   RECOMMENDATIONS:  I think Mr. Kitt is doing well. He has not been  taking the hydrochlorothiazide since his blood pressures have been low-  normal at home I think we can leave him off of this. I talked about  switching his metoprolol to Toprol which is longer acting, but he says  this is not paid for by his insurance so we will leave him where he is.  He in on low-dose statin, but his LDLs are quite low so we will not  change that either. I will plan to see him back in six months and will  plan to check his pacemaker at that time.     Bruce Elvera Lennox Juanda Chance, MD, [facility]  Electronically Signed    BRB/MedQ  DD: 01/29/2007  DT: 01/29/2007  Job #: [id number]   cc:   Rosalyn Gess. Norins, MD

## 2010-11-28 ENCOUNTER — Ambulatory Visit (INDEPENDENT_AMBULATORY_CARE_PROVIDER_SITE_OTHER): Payer: Medicare Other | Admitting: *Deleted

## 2010-11-28 DIAGNOSIS — I4891 Unspecified atrial fibrillation: Secondary | ICD-10-CM

## 2010-11-30 NOTE — Assessment & Plan Note (Signed)
[facility]                         ELECTROPHYSIOLOGY OFFICE NOTE   [name], [name] [name]                      MRN:          [id number]  DATE:07/28/2006                            DOB:          11-04-1924    Mr. Spencer Jones was seen today in the clinic on July 28, 2006, for wound  check of his newly implanted [place]. Jude model [phone] Donnalee Curry, date of  implant was July 10, 2006, for sick sinus syndrome.  On  interrogation of his device today, his battery voltage is 2.79.  P waves  measured 1.3 to 1.7 millivolts with an atrial capture threshold of 0.75  volts at 0.5 milliseconds and an atrial lead impedance of 353 ohms.  R  waves measured 3.5 to 5.1 millivolts with a ventricular pacing threshold  of 1.5 volts at 0.5 milliseconds and a ventricular lead impedance of 405  ohms.  There were 16-mode switch episodes noted since implant, totaling  63% of the time.  We did put his rate response on today and his Steri-  Strips are removed.  His wound was without redness or edema and he will  see Dr. Juanda Chance back later this month.      Altha Harm, LPN  Electronically Signed      Everardo Beals. Juanda Chance, MD, [facility]  Electronically Signed   PO/MedQ  DD: 07/28/2006  DT: 07/29/2006  Job #: [id number]

## 2010-11-30 NOTE — Assessment & Plan Note (Signed)
[facility]                             PRIMARY CARE OFFICE NOTE   Spencer Jones, Spencer Jones                      MRN:          [id number]  DATE:05/22/2006                            DOB:          [birth year]    Mr. Hallas is a 77 year old gentleman who presents for followup evaluation  and exam.  I last saw him May 23, 2005.  Please see that complete H and  P.  In the interval, he has been followed on a regular basis by the  cardiology service with last visit with Dr. Glennon Hamilton, Nov 12, 2005.  Last  visit to Dr. Charlies Constable, July 11, 2005.   The patient does see Dr. Mickel Crow on a regular basis for followup of his  prostate CA with his last PSA being 0.550.   The patient has no active complaints at today's visit but he is concerned  about what he thinks may be a sebaceous cyst mid back.   CURRENT MEDICATIONS:  1. Aspirin 81 mg daily.  2. Coumadin as directed.  3. Allopurinol 150 mg daily.  4. Lipitor 10 mg daily.  5. Lotensin 40 mg daily.  6. Hydrochlorothiazide 25 mg daily.  7. Flomax 0.4 mg daily.  8. Diltiazem 180 mg daily.  9. Metoprolol 12.5 mg b.i.d.  10.Viagra p.r.n.  11.Celebrex p.r.n.   REVIEW OF SYSTEMS:  CONSTITUTIONAL:  Negative. CARDIOVASCULAR:  Negative.  RESPIRATORY:  Negative. GI:  Negative.  GU:  Negative.  GU:  Negative.   EXAMINATION:  Temperature was 97.2.  Blood pressure 146/83.  Pulse 52.  Weight 207.  Height 6 feet.  GENERAL APPEARANCE:  Well-nourished, well-developed, Caucasian male, looking  younger than his stated chronologic age in no acute distress.  HEENT:  Normocephalic, atraumatic.  EACs and TMs were normal.  Oropharynx  without lesions.  Conjunctivae and sclerae were clear.  Pupils equal, round,  and reactive.  NECK:  Supple.  CHEST:  With CVA tenderness.  LUNGS:  Clear with no rales, wheezes, or rhonchi.  CARDIOVASCULAR:  2+ radial pulse.  He had a quiet precordium.  He had a  regular rate and  rhythm without murmurs, rubs, or gallops.  He has a  palpable implanted device anterior chest wall.  ABDOMEN:  Soft.  No guarding or rebound.  No organosplenomegaly was  appreciated.  GENITALIA:  Deferred to Dr. Etta Grandchild.  RECTAL:  Deferred to Dr. Etta Grandchild.  EXTREMITIES:      Without clubbing, cyanosis, edema, deformity.  DERM:  The patient has a large comedo on the mid back.  There is also a  raised area of indurated tissue suspicious for a sebaceous cyst.   LABORATORY:  From May 09, 2006, with a comprehensive metabolic panel  being normal with a glucose of 95, cholesterol is 113, triglycerides 84, HDL  is 39.8, LDL was 56.   PROCEDURE:  With informed consent from the patient verbally, the area on his  back with comedo and possible cyst was prepped with Betadine.  In a sterile  fashion using a #15 scalpel, a 3 cm incision was made through  the  subcutaneous tissue and cutaneous tissue.  I did not find a sebaceous cyst.  I did remove a comedo.  Closed the wound with 4 interrupted sutures using 3-  0 Ethilon.  A bandage was applied.  The patient was given routine wound  precautions.  He did stop his Coumadin 5 days prior to this procedure but  there was some mild bleeding.  The patient is asked to return on May 23, 2006, at 10 a.m. for a wound check.   ASSESSMENT AND PLAN:  1. Cardiovascular.  The patient is stable, followed by Dr. Glennon Hamilton on      a regular basis.  2. Hypertension, controlled.  3. Prostate cancer, stable.  4. Paroxysmal atrial fibrillation, stable.  5. Gout, not active.  6. Lipids, well-controlled.  7. Dermatologic comedo removed.  The patient is to return for wound check      as noted.    ______________________________  Rosalyn Gess Norins, MD    MEN/MedQ  DD: 05/23/2006  DT: 05/23/2006  Job #: [id number]

## 2010-11-30 NOTE — Op Note (Signed)
NAME:  Spencer Jones, Spencer Jones NO.:  [id number]   MEDICAL RECORD NO.:  [id number]          PATIENT TYPE:  AMB   LOCATION:  DAY                          FACILITY:  [facility]   PHYSICIAN:  Claudette Laws, M.D.  DATE OF BIRTH:  1925/01/24   DATE OF PROCEDURE:  02/28/2005  DATE OF DISCHARGE:                                 OPERATIVE REPORT   PREOPERATIVE DIAGNOSIS:  Symptomatic right spermatocele.   POSTOPERATIVE DIAGNOSIS:  Symptomatic right spermatocele.   OPERATIONS:  Right spermatocelectomy.   SURGEON:  Claudette Laws, M.D.   DESCRIPTION OF PROCEDURE:  The patient was prepped and draped in the supine  position under intubated general anesthesia, he was noted to have a markedly  enlarged right hemiscrotum consistent with preop evaluation and scrotal  ultrasound. A transverse vessel splitting incision was made over the right  hemiscrotum and dissection was carried down through the various tunica  layers. The spermatocele was then delivered through the incision and then  all the investing surrounding tissue was swept off with blunt dissection.  All bleeders were electrocoagulated. We then opened the sac and  approximately 500 mL of clear fluid was aspirated. The testicle was examined  and felt to be normal, slightly atrophic. I then removed the excess  spermatocele sac and then oversewed the edges with a running 3-0 chromic  suture. At the conclusion, the wound was irrigated out with copious amounts  of saline and then I injected approximately 20 mL of 0.5% Marcaine for  spermatic cord block and also subcutaneous tissues. The testicle with the  spermatic cord was then placed back into the scrotum care being taken not to  twist the cord. The scrotum was then closed in two layers with interrupted 3-  0 chromic sutures and then the skin was approximated with a running 4-0  Monocryl suture. Prior to closure, a 1/4 inch Penrose drain was brought out  through the dependent  portion of the scrotum and sewed to the skin with a  nylon suture. A fluff dressing was applied along with a scrotal support and  the patient was then taken back to the recovery room in satisfactory  condition.      Claudette Laws, M.D.  Electronically Signed     RFS/MEDQ  D:  02/28/2005  T:  02/28/2005  Job:  [id number]

## 2010-11-30 NOTE — Discharge Summary (Signed)
NAME:  Spencer Jones, Spencer Jones NO.:  [id number]   MEDICAL RECORD NO.:  [id number]          PATIENT TYPE:  OIB   LOCATION:  2899                         FACILITY:  [facility]   PHYSICIAN:  Everardo Beals. Juanda Chance, MD, FACCDATE OF BIRTH:  07-Jul-1925   DATE OF ADMISSION:  07/10/2006  DATE OF DISCHARGE:  07/10/2006                               DISCHARGE SUMMARY   PRINCIPAL DIAGNOSIS:  1. Discharging day of generator change for existing pacemaker which      was at elective replacement indicator.  2. The patient is status post explantation of existing St. Jude      Trilogy pacemaker implanted 1999 for sick sinus syndrome and      implant of St. Jude Victory XL DR, model 5816 dual-chamber      pacemaker.   SECONDARY DIAGNOSES:  1. History of implant St. Jude Trilogy DDD pacemaker 1999 for sick      sinus syndrome.  2. History of coronary artery disease status post coronary artery      bypass graft surgery 1991.  3. Negative Myoview scan 2006.  4. Hypertension.  5. Dyslipidemia.  6. Paroxysmal atrial fibrillation.  7. History of radiation treatment for prostate cancer.   PROCEDURE:  July 10, 2006 explantation of existing St. Jude  pacemaker which is an ARI with implantation of St. Jude Victory dual-  chamber pacemaker by Dr. Charlies Constable.  The patient had no postprocedure  complications.   The patient is advised to remove the bandage the morning of Friday,  December 28 and to leave the incision open to the air.  He is asked not  to drive for next 4 days.  He is asked not to lift anything heavier than  10 pounds for next 2 weeks.  He is to keep his incision dry for next 7  days and to sponge bathe until Thursday January 3.   DISCHARGE MEDICATIONS:  He will go home on the following medications:  1. Antibiotic, clindamycin 300 mg t.i.d. for the next 5 days.  2. Enteric-coated aspirin 81 mg daily.  3. Allopurinol 150 mg daily.  4. Lipitor 10 mg daily at bedtime.  5. Lotensin 40  mg daily.  6. Hydrochlorothiazide 25 mg daily.  7. Flomax 0.4 mg daily.  8. Diltiazem 180 mg daily.  9. Metoprolol 25 mg daily.  10.Coumadin 2.5 mg daily except for 5 mg on Monday. this is at a      dosage which she will restart today Thursday December 27.  11.Also, the patient has nitroglycerin tabs 0.4 mg 1 tablet the tongue      every 5 minutes times 3 doses as need for chest pain.   FOLLOW UP:  He has follow-up [facility] [address]  1. Coumadin clinic Thursday January 3 at 9 o'clock.  2. Pacer clinic Monday, January 14 at 3:30. [employer]. Jude representatives      will be there to interrogate the pacemaker.  3. He will see Dr. Juanda Chance Wednesday, January 13 at 10 o'clock   BRIEF HISTORY:  Mr. Reser is a 77 year old male.  He has sick sinus  syndrome and paroxysmal atrial fibrillation.  He had a Trilogy DDD  pacemaker implanted in 1999.  This device is now at elective replacement  indicator the patient will present electively to have this generator  change.  The patient also has a past medical history which includes  coronary artery disease status post coronary artery bypass graft surgery  1991, hypertension, dyslipidemia, paroxysmal atrial fibrillation and  radiation therapy for prostate cancer.  Overall the patient is doing  quite well on his medication therapy.   HOSPITAL COURSE:  The patient presented electively July 10, 2006.  He underwent explantation of his existing pacemaker with implantation of  the new pacemaker by Dr. Juanda Chance.  The patient has had no postprocedural  complications, discharging the same day.  He has follow-up as dictated  above.  The laboratory studies are not available at the time of this  dictation.      Maple Mirza, PA      Bruce R. Juanda Chance, MD, [facility]  Electronically Signed    GM/MEDQ  D:  07/10/2006  T:  07/10/2006  Job:  [id number]   cc:   Christella Noa, M.D.

## 2010-11-30 NOTE — Assessment & Plan Note (Signed)
 HEALTHCARE                              CARDIOLOGY OFFICE NOTE   Spencer Jones, Spencer Jones                      MRN:          [id number]  DATE:02/03/2006                            DOB:          July 25, 1924    ADDENDUM:   INTERROGATION:  [name] pacemaker did not indicate that he had ERI.  He  is only using his pacemaker a small percentage of time.  However, he does  have rates that are higher than we would like, and so I have stopped the  Norvasc and put him on Cardizem CD 180 to get better rate control.  Will  have him come back in six months and will follow him [place] in the  interim.                               Bruce Elvera Lennox Juanda Chance, MD, [facility]    BRB/MedQ  DD:  02/03/2006  DT:  02/03/2006  Job #:  [id number]

## 2010-11-30 NOTE — Assessment & Plan Note (Signed)
[facility] HEALTHCARE                              CARDIOLOGY OFFICE NOTE   Spencer Jones, Spencer Jones                      MRN:          [id number]  DATE:02/03/2006                            DOB:          1925/04/25    PRIMARY CARE PHYSICIAN:  Rosalyn Gess. Norins, MD.   PRIMARY CARDIOLOGIST:  Cecil Cranker, MD, [facility].   CLINICAL HISTORY:  Spencer Jones is 77 years old, had a Trilogy DDD pacemaker  implanted for sick sinus syndrome in 1999.  He has paroxysmal atrial  fibrillation.  He recently was found to be at [facility] by telephone checks and  came in today for full interrogation and probable scheduling for generator  change.   He also has coronary artery disease and had coronary artery bypass graft  surgery in 1991.  He had a Myoview scan performed a year ago which did not  show any ischemia.   He said he has had no recent chest pain, shortness of breath or palpitations  and has been feeling quite well.   PAST MEDICAL HISTORY:  Significant for hypertension, hyperlipidemia, and  prostate cancer for which he has been treated with radiation.   CURRENT MEDICATIONS:  Include aspirin, Coumadin, allopurinol, Lipitor,  Lotensin, hydrochlorothiazide, Norvasc, metoprolol and Flomax.   EXAMINATION:  The blood pressure is 123/78 and the pulse 63 and regular.  There is no venous distension.  The carotid pulses were full without bruits.  CHEST:  Clear.  HEART:  Rhythm was regular.  He had no murmurs, rubs or gallops.  ABDOMEN:  Soft without organomegaly.  Peripheral pulses were full.  There  was no peripheral edema.   IMPRESSION:  1.  Sick sinus syndrome with paroxysmal atrial fibrillation.  2.  Status post Trilogy DDD pacemaker implantation in 1999, now at elective      replacement indicator by telephone check.  3.  Coronary artery disease status post coronary artery bypass graft surgery      in 1991 with negative Myoview scan 2006.  4.  Hypertension.  5.   Hyperlipidemia.  6.  Status post radiation treatment for prostate cancer.   RECOMMENDATIONS:  I will plan to do a full interrogation to Spencer Jones  pacemaker and if he is at [facility], will arrange for him to come in for a  generator change.  We will need to stop his  Coumadin four days prior to his generator change.  He has a preference to  wait until after Labor Day and will see what his interrogation shows then  made a decision if that is appropriate.                               Bruce Elvera Lennox Juanda Chance, MD, [facility]    BRB/MedQ  DD:  02/03/2006  DT:  02/03/2006  Job #:  [id number]

## 2010-11-30 NOTE — Assessment & Plan Note (Signed)
[facility] HEALTHCARE                            CARDIOLOGY OFFICE NOTE   EHTAN, DELFAVERO                      MRN:          [id number]  DATE:06/19/2006                            DOB:          July 31, 1924    PRIMARY CARE PHYSICIAN:  Rosalyn Gess. Norins, M.D.   CARDIOLOGIST:  Cecil Cranker, MD, [facility]   CLINICAL HISTORY:  Spencer Jones is 77 years old and has sick sinus  syndrome with paroxysmal atrial fibrillation with a Trilogy DDD  pacemaker implanted.  He also had coronary artery bypass graft surgery  in 1991.   He has been doing quite well, and has had no recent chest pain,  shortness of breath or palpitations.  He does get somewhat fatigued, but  attributes this to his age.  He was found to be at [facility] today.   PAST MEDICAL HISTORY:  1. Hypertension.  2. Hyperlipidemia.  3. Prostate cancer, treated with radiation treatment.   CURRENT MEDICATIONS:  1. Aspirin.  2. Coumadin.  3. Allopurinol.  4. Lipitor.  5. Lotensin.  6. Hydrochlorothiazide.  7. Flomax.  8. Diltiazem.  9. Metoprolol.   PHYSICAL EXAMINATION:  VITAL SIGNS:  The blood pressure was 140/70,  pulse 59 and regular.  NECK:  There was no venous distention.  The carotid pulses were full,  and there were no bruits.  CHEST:  Clear.  HEART:  Rhythm was regular.  Heart sounds were normal with no murmurs or  gallops.  ABDOMEN:  Soft without organomegaly.  EXTREMITIES:  Peripheral pulses were full.  There was no peripheral  edema.   We interrogated the pacemaker today, and he has sinus rhythm much of the  time; however, even though his AV delay mechanism is set at 300, he  still fuses most of the time on his ventricle.  His threshold on the  ventricular lead was 2.0, which is up slightly.  During threshold  testing he was found to be at [facility] with a battery voltage of 2.36 volts.   IMPRESSION:  1. Sick sinus syndrome with paroxysmal atrial fibrillation.  2. Status post Trilogy DDD  pacemaker implantation, now at [facility].  3. Coronary artery disease, status post coronary bypass graft surgery      in 1991.  4. Hypertension.  5. Hyperlipidemia.  6. Status post radiation for prostate cancer.   RECOMMENDATIONS:  [name] is at [facility], and will plan a generator change  later this month.  He would prefer to do it after Christmas, and will  schedule this on July 10, 2006.  His current pacemaker is programmed  to DVI because we do not have mode switch, and the longest AV delay is  300, so, with the new generator we can put his AV delay longer and allow  for intrinsic conduction and put him on mode switch, and we can also put  him on auto capture, since he threshold on the ventricular lead is  slightly increased.  We should be able to get by without a new lead.  Will stop his Coumadin 3 days before his pacer implant for his  generator  change.     Bruce Elvera Lennox Juanda Chance, MD, [facility]  Electronically Signed    BRB/MedQ  DD: 06/19/2006  DT: 06/19/2006  Job #: [id number]

## 2010-11-30 NOTE — Op Note (Signed)
NAME:  Spencer Jones, Spencer Jones NO.:  [id number]   MEDICAL RECORD NO.:  [id number]          PATIENT TYPE:  OIB   LOCATION:  2899                         FACILITY:  [facility]   PHYSICIAN:  Everardo Beals. Juanda Chance, MD, FACCDATE OF BIRTH:  [birth year]   DATE OF PROCEDURE:  07/10/2006  DATE OF DISCHARGE:                               OPERATIVE REPORT   CLINICAL HISTORY:  Mr. Route is 77 years old and had a previous Trilogy  DR pacemaker implanted seven years ago for sick sinus syndrome.  He  recently reached ERI and was brought in for a generator change.  His old  pacemaker did not have mode switch so he was in DDI mode and the longest  AV delay we could obtain was 300 so he was fusing quite a bit with those  settings.  He also has had previous bypass surgery.   PROCEDURE:  Explantation of the old Trilogy DR DDD pacemaker (model  number [id number], serial number [id number]), inspection of the old atrial lead  (model number 1388TC/52, serial number [id number] implanted August 15, 1997), and inspection of the old ventricular lead (model number  1388TC/58, serial number [id number], date of implant August 15, 1997), and  implantation of a new Victory XL DR DDDR pacemaker (model number [id number],  serial number [id number])  (__________  of life 98.5 beats per minute/2.7 8  volts, ERI 86.3BMM/2.45 volts).   ANESTHESIA:  1% local Xylocaine.   ESTIMATED BLOOD LOSS:  Less than 15 mL.   COMPLICATIONS:  None.   PROCEDURE:  The procedure was performed in laboratory room number 6.  The right anterior chest was prepped and draped in the usual fashion.  The skin and subcutaneous tissues were anesthetized with 1% local  Xylocaine.  An incision made over the pocket just below the old incision  and extended to the pocket.  The pocket was opened and the generator was  removed.  The leads were inspected with good pacing parameters as  described below.  The pocket was irrigated with sterile kanamycin  solution.  The leads  were attached to the new generator with the  __________ wrench and the generator was implanted into the pocket.  The  subcutaneous tissue was closed with 2-0 Vicron.  The skin was closed  with running 4-0 Vicron.   PACING PARAMETERS:  Atrial lead:  P wave 2.2 mV.  Minimum threshold  capture 0.8 volts at a pulse width of 0.5 milliseconds, impedance 384  ohms.   Ventricular lead:  R wave 8.7 mV.  Minimum threshold capture 1.2 volts  at a pulse width of 0.5 milliseconds, impedance 403 ohms.   The patient tolerated the procedure well and left the laboratory in  satisfactory.   ADDENDUM:  The patient developed 2:1 AV block with pacing rates above  90. We think he is in sinus rhythm much of the time and has a good rate  response with his sinus node.  Will interrogate his pacemaker when he  returns and see if this is the case or if we need to make further  adjustments in his settings.  Bruce Elvera Lennox Juanda Chance, MD, [facility]  Electronically Signed     BRB/MEDQ  D:  07/10/2006  T:  07/10/2006  Job:  [id number]

## 2010-11-30 NOTE — Assessment & Plan Note (Signed)
Spencer Jones                            CARDIOLOGY OFFICE NOTE   Spencer Jones, Spencer Jones                      MRN:          [id number]  DATE:08/13/2006                            DOB:          02-07-1925    PRIMARY CARE PHYSICIAN:  Spencer Jones   CARDIOLOGIST:  Dr. Glennon Hamilton   CLINICAL HISTORY:  Spencer Jones is 77 years old and has sic sinus syndrome  with paroxysmal atrial fibrillation.  He recently had a generator change  with implantation of a new St. Jude Victory DDD pacemaker.  He has done  quite well since that time.  He felt he was maybe slightly fatigued and  his rate response was activated last time.   He also has coronary artery disease and has had previous bypass surgery.   PAST MEDICAL HISTORY:  Is significant for hypertension, hyperlipidemia  and prostate cancer treated with radiation therapy.   CURRENT MEDICATIONS:  Include aspirin, Coumadin, allopurinol, Lotensin,  hydrochlorothiazide, Flomax, diltiazem, metoprolol.   EXAMINATION:  His blood pressure has been running in the 120/80 range  and his pulse in the 70 range.  There was no venous distention.  The carotid pulses were full without  bruits.  CHEST:  Was clear.  CARDIAC:  The pacer site was well healed, although there was a small bit  of suture protruding.  ABDOMEN:  Was soft with normal bowel sounds.  There is no  hepatosplenomegaly.  Peripheral pulses were full with no peripheral edema.   We interrogated the pacemaker, had a good threshold on the ventricular  lead.  He had sinus rate between 60 and 80 and was pacing at 14% of the  time.  He was intrinsic on the ventricle the majority of the time.   IMPRESSION:  1. Status post pacemaker generator change with a new St. Jude Victory      DDD pacemaker with good function.  2. Sic sinus syndrome with Taxus and atrial fibrillation.  3. Coronary artery disease, status post prior coronary artery bypass      grafting in  1991.  4. Hypertension.  5. Hyperlipidemia.  6. History of prostate cancer  7. Moderate renal insufficiency with the creatinine's in the range of      1.6.   RECOMMENDATIONS:  I think Spencer Jones is doing well.  He feels a little  fatigue and wondered if he could cut back on some of his blood pressure  medicines and we cut back on his Lotensin from 40 to 20 a day.  We will  plan to see him back in followup in 6 months.     Spencer Elvera Lennox Juanda Chance, MD, [facility]  Electronically Signed    BRB/MedQ  DD: 08/13/2006  DT: 08/13/2006  Job #: [id number]

## 2010-11-30 NOTE — Op Note (Signed)
NAME:  Spencer Jones, Spencer Jones                      ACCOUNT NO.:  [id number]   MEDICAL RECORD NO.:  [id number]                   PATIENT TYPE:  AMB   LOCATION:  DSC                                  FACILITY:  [facility]   PHYSICIAN:  Currie Paris, M.D.           DATE OF BIRTH:  [birth year]   DATE OF PROCEDURE:  07/21/2002  DATE OF DISCHARGE:                                 OPERATIVE REPORT   CCS [phone]   PREOPERATIVE DIAGNOSIS:  Right inguinal hernia.   POSTOPERATIVE DIAGNOSIS:  Right inguinal hernia, direct and indirect.   OPERATION PERFORMED:  Repair of right inguinal hernia with mesh.   SURGEON:  Currie Paris, M.D.   ANESTHESIA:  MAC.   INDICATIONS FOR PROCEDURE:  The patient is a 77 year old man who has  presented with a right inguinal hernia that was symptomatic and he wished to  have repaired.   DESCRIPTION OF PROCEDURE:  The patient was seen in the holding area and had  no further questions.  The right inguinal area was marked as the operative  site.  The patient was taken to the operating room and after being given  satisfactory IV sedation, the area was shaved, prepped and draped.  A  combination of 1% plain Xylocaine and 0.5% Marcaine plain was mixed equally  and used for local.  I infiltrated along the skin line and then below the  fascia above the anterior superior iliac spine.  The incision was made and  deepened to the external oblique aponeurosis with additional local  infiltrated as we got through deeper layers.  Bleeders were coagulated or  tied with 4-0 Vicryl.  The external oblique was opened in line of its fibers  and the cord structures dissected up off the inguinal floor and surrounded  with a Penrose drain.  There was a significant bulge medial to the  epigastrics indicating he had a significant direct hernia with basically no  inguinal floor left.  There was also a large bulge coming out through the  deep ring.  I dissected and cleaned off the sac  from the cord, opened it,  reduced some small bowel that was protruding out, pursestringed it and  reduced it.  I tacked the transversalis closed over this, lateral to the  deep ring to try to keep this reduced.  The ilioinguinal nerve was found and  kept with the cord structures.  I put an extra large mesh plug in the defect  and sutured it circumferentially around with some 2-0 Prolenes.  The patch  was then overlaid over this and sutured with a running suture inferiorly  starting at the pubic tubercle and working laterally to the deep ring and  then tacked with several sutures medially to the internal oblique.  Tails  were crossed and went well lateral and tacked down so that there would be no  indirect recurrence under this.   This produced a nice repair with  no tension.  It all appeared to be intact.  Everything appeared to be dry.  The incision was closed with a running 3-0  Vicryl on the external oblique and Scarpa's and 4-0 Monocryl subcuticular on  the skin.  The patient tolerated the procedure well.  There were no  operative complications.  All counts correct.                                                Currie Paris, M.D.    CJS/MEDQ  D:  07/21/2002  T:  07/21/2002  Job:  [id number]   cc:   Rosalyn Gess. Norins, M.D. [facility]

## 2010-11-30 NOTE — Assessment & Plan Note (Signed)
[facility]                              CARDIOLOGY OFFICE NOTE   Spencer Jones, Spencer Jones                      MRN:          [id number]  DATE:05/15/2006                            DOB:          [birth year]    The patient is a 77 year old white, married male with longstanding coronary  artery disease, paroxysmal atrial fibrillation, and sick sinus syndrome with  a Trilogy DDD pacemaker implanted in 1999.  He has been followed by Dr.  Juanda Chance. His atrial fibrillation has been paroxysmal.  He is not symptomatic.  He has no shortness of breath, chest discomfort. He does have some fatigue.  Dr. Juanda Chance saw him in July and thought he was near time for elective  replacement, however, he has been on telephone checks and  apparently is not  at the end of life as yet.  The patient had a CABG in 1991 by Dr. Nedra Hai, had a  negative Myoview scan in 2006. He also has hypertension, hyperlipidemia,  and he has had radiation treatment for prostate cancer.   Dr. Regino Schultze note of February 03, 2006 was reviewed as well as the most recent  scan and 2-D echo.   MEDICATIONS:  1. Aspirin 81 mg.  2. Coumadin.  3. Allopurinol 150 mg.  4. Lipitor 10.  5. Lotensin 40 mg.  6. Hydrochlorothiazide 12.5  7. Flomax 0.4.  8. Diltiazem 180 mg.  9. Toprol-XL 25 mg daily which we changed to 25 mg b.i.d.   PHYSICAL EXAMINATION:  VITAL SIGNS: Blood pressure 124/72, pulse 58, atrial  fibrillation with ventricular pacing.  JVP is not elevated, carotid pulses palpable without bruits.  LUNGS:  Clear.  CARDIAC:  No significant murmur.  ABDOMEN:  Normal.  EXTREMITIES:  Normal.   EKG reveals atrial fibrillation with ventricular pacing on demand.   IMPRESSION:  As above.   The patient is doing quite well.  He is to see Dr. Juanda Chance in December and I  will see him in four months or p.r.n..    ______________________________  E. Graceann Congress, MD, [facility]   EJL/MedQ  DD: 05/15/2006  DT:  05/15/2006  Job #: [id number]

## 2010-11-30 NOTE — Assessment & Plan Note (Signed)
[facility]                            CARDIOLOGY OFFICE NOTE   Spencer Jones, Spencer Jones ECHO [facility]                      MRN:          [id number]  DATE:10/28/2006                            DOB:          1925/03/28    Mr. Spencer Jones is a very pleasant 77 year old white married male with long  history of coronary artery disease, paroxysmal atrial fibrillation, sick  sinus syndrome , with a trilogy DDD pacemaker, recent generator  replacement by Dr. Juanda Chance.  I first saw him in 79.  He had coronary  artery bypass graft surgery in 1991 by Dr. Olga Millers and a negative  Myoview scan in 2006.   He has hypertension, hyperlipidemia and has had radiation treatment for  prostate cancer.   Symptomatically he is getting along quite well.  Dr. Juanda Chance has seen him  most recently August 13, 2006.  The patient having no chest pain,  shortness of breath, or palpitations.   MEDICATIONS:  1. Aspirin 81.  2. Coumadin.  3. Allopurinol  4. Lipitor 10.  5. Hydrochlorothiazide 25.  6. Flomax 0.4.  7. Diltiazem 180.  8. Metoprolol 25.  9. Lotensin 20.   The patient has had creatinine in the range of 1.6.  The recent labs  revealed a creatinine of 1.4, BUN 22, LDL 44, HDL 38, triglycerides of  191.   His EKG reveals sinus bradycardia.  With the magnet he paces at rate of  98.   Blood pressure 140/90.  His home blood pressures have been under 120/80  range.  General appearance normal. JVP is not elevated.  Carotid pulses  are palpable and equal without bruits.  Lungs are clear, cardiac exam is  normal with no murmur, gallop or rub.  Abdominal exam is normal.  Extremities with no edema, pulses palpably equal bilaterally.   DIAGNOSES:  As above.   The patient is getting along extremely well.  I have suggested he see  Dr. Juanda Chance for followup concerning his pacemaker and general cardiology  care.  His next appointment is in June.     Cecil Cranker, MD, [facility]  Electronically  Signed   EJL/MedQ  DD: 10/28/2006  DT: 10/28/2006  Job #: [id number]

## 2010-11-30 NOTE — Op Note (Signed)
NAME:  Spencer Jones, Spencer Jones                      ACCOUNT NO.:  [id number]   MEDICAL RECORD NO.:  [id number]                   PATIENT TYPE:  AMB   LOCATION:  DSC                                  FACILITY:  [facility]   PHYSICIAN:  Sandria Bales. Ezzard Standing, M.D.               DATE OF BIRTH:  1924/11/28   DATE OF PROCEDURE:  03/13/2004  DATE OF DISCHARGE:                                 OPERATIVE REPORT   PREOPERATIVE DIAGNOSIS:  Inflamed sebaceous cyst of upper sternum  approximately 3 cm in diameter.   POSTOPERATIVE DIAGNOSIS:  Inflamed sebaceous cyst of upper sternum  approximately 3 cm in diameter.   OPERATION PERFORMED:  Excision of sebaceous cyst of upper sternum  approximately 3 cm in diameter.   SURGEON:  Sandria Bales. Ezzard Standing, M.D.   ANESTHESIA:  15 mL of 1% Xylocaine.   COMPLICATIONS:  None.   INDICATIONS FOR PROCEDURE:  Mr. Eisner is a pleasant 77 year old white male  who has an inflamed sebaceous cyst of his upper sternum.  He is on chronic  Coumadin which he has held now for seven days to allow [place] to excise this  infected sebaceous cyst.   DESCRIPTION OF PROCEDURE:  The patient was placed in supine position.  His  upper chest was prepped with Betadine solution and sterilely draped.  An  elliptical incision was made around the cyst and the cyst excised in its  entirety.  The wound was then cleaned with saline, closed with interrupted 3-  0 nylon sutures and sterilely dressed.  He will see me back in 10 to 14 days  for suture removal.  The cyst was sent off to pathology.  The patient  tolerated the procedure well.  He will restart his Coumadin tomorrow.                                               Sandria Bales. Ezzard Standing, M.D.    DHN/MEDQ  D:  03/13/2004  T:  03/13/2004  Job:  [id number]   cc:   Rosalyn Gess. Norins, M.D. [facility]   Bea Laura Graceann Congress, M.D.

## 2010-12-05 ENCOUNTER — Ambulatory Visit (INDEPENDENT_AMBULATORY_CARE_PROVIDER_SITE_OTHER): Payer: Medicare Other | Admitting: *Deleted

## 2010-12-05 DIAGNOSIS — I4891 Unspecified atrial fibrillation: Secondary | ICD-10-CM

## 2010-12-05 LAB — POCT INR: INR: 2.2

## 2010-12-05 MED ORDER — WARFARIN SODIUM 2.5 MG PO TABS: ORAL_TABLET | ORAL | Status: DC

## 2010-12-20 ENCOUNTER — Telehealth: Payer: Self-pay | Admitting: Cardiology

## 2010-12-20 MED ORDER — BENAZEPRIL HCL 40 MG PO TABS: 40.0000 mg | ORAL_TABLET | ORAL | Status: DC

## 2010-12-20 MED ORDER — ATORVASTATIN CALCIUM 20 MG PO TABS: 20.0000 mg | ORAL_TABLET | Freq: Every day | ORAL | Status: DC

## 2010-12-20 NOTE — Telephone Encounter (Signed)
Spoke with pt wife. RX sent into pharmacy

## 2010-12-20 NOTE — Telephone Encounter (Signed)
Refill lipitor 20 mg. lotensin 40 mg. [facility] mail order.

## 2010-12-24 ENCOUNTER — Other Ambulatory Visit: Payer: Self-pay | Admitting: *Deleted

## 2010-12-24 ENCOUNTER — Encounter: Payer: Self-pay | Admitting: Cardiology

## 2010-12-25 ENCOUNTER — Telehealth: Payer: Self-pay | Admitting: Cardiology

## 2010-12-25 NOTE — Telephone Encounter (Signed)
Needs clarification on 2 medications.  Please call them at number listed.  Ref number [id number].

## 2010-12-26 ENCOUNTER — Ambulatory Visit (INDEPENDENT_AMBULATORY_CARE_PROVIDER_SITE_OTHER): Payer: Medicare Other | Admitting: *Deleted

## 2010-12-26 DIAGNOSIS — I4891 Unspecified atrial fibrillation: Secondary | ICD-10-CM

## 2010-12-27 NOTE — Telephone Encounter (Signed)
Spoke with pharmacist seems as if both refills had two different directions on them I corrected this matter and gave pt 1 year supply on Lipitor and benazepril

## 2011-01-02 ENCOUNTER — Telehealth: Payer: Self-pay | Admitting: Cardiology

## 2011-01-02 DIAGNOSIS — I10 Essential (primary) hypertension: Secondary | ICD-10-CM

## 2011-01-02 MED ORDER — METOPROLOL TARTRATE 50 MG PO TABS: 50.0000 mg | ORAL_TABLET | Freq: Two times a day (BID) | ORAL | Status: DC

## 2011-01-02 NOTE — Telephone Encounter (Signed)
Pt calling because he is out of his metoprolol and because the bottle says no refills he wanted to make sure he is supposed to continue it.  New RX sent into [facility] on [address] as requested.  90 day supply also sent into [facility].

## 2011-01-02 NOTE — Telephone Encounter (Signed)
Pt calling re medication °

## 2011-01-09 ENCOUNTER — Ambulatory Visit (INDEPENDENT_AMBULATORY_CARE_PROVIDER_SITE_OTHER): Payer: Medicare Other | Admitting: *Deleted

## 2011-01-09 DIAGNOSIS — I4891 Unspecified atrial fibrillation: Secondary | ICD-10-CM

## 2011-01-30 ENCOUNTER — Ambulatory Visit (INDEPENDENT_AMBULATORY_CARE_PROVIDER_SITE_OTHER): Payer: Medicare Other | Admitting: *Deleted

## 2011-01-30 DIAGNOSIS — I4891 Unspecified atrial fibrillation: Secondary | ICD-10-CM

## 2011-01-30 LAB — POCT INR: INR: 3.4

## 2011-02-20 ENCOUNTER — Ambulatory Visit (INDEPENDENT_AMBULATORY_CARE_PROVIDER_SITE_OTHER): Payer: Medicare Other | Admitting: *Deleted

## 2011-02-20 DIAGNOSIS — I4891 Unspecified atrial fibrillation: Secondary | ICD-10-CM

## 2011-02-20 LAB — POCT INR: INR: 3.1

## 2011-03-06 ENCOUNTER — Ambulatory Visit (INDEPENDENT_AMBULATORY_CARE_PROVIDER_SITE_OTHER): Payer: Medicare Other | Admitting: *Deleted

## 2011-03-06 DIAGNOSIS — I4891 Unspecified atrial fibrillation: Secondary | ICD-10-CM

## 2011-03-12 ENCOUNTER — Encounter: Payer: Self-pay | Admitting: *Deleted

## 2011-03-22 ENCOUNTER — Ambulatory Visit (INDEPENDENT_AMBULATORY_CARE_PROVIDER_SITE_OTHER): Payer: Medicare Other | Admitting: *Deleted

## 2011-03-22 DIAGNOSIS — I4891 Unspecified atrial fibrillation: Secondary | ICD-10-CM

## 2011-04-15 ENCOUNTER — Encounter: Payer: Self-pay | Admitting: Cardiology

## 2011-04-17 ENCOUNTER — Ambulatory Visit (INDEPENDENT_AMBULATORY_CARE_PROVIDER_SITE_OTHER): Payer: Medicare Other | Admitting: *Deleted

## 2011-04-17 ENCOUNTER — Ambulatory Visit (INDEPENDENT_AMBULATORY_CARE_PROVIDER_SITE_OTHER): Payer: Medicare Other | Admitting: Cardiology

## 2011-04-17 ENCOUNTER — Encounter: Payer: Self-pay | Admitting: Cardiology

## 2011-04-17 DIAGNOSIS — E785 Hyperlipidemia, unspecified: Secondary | ICD-10-CM

## 2011-04-17 DIAGNOSIS — I4891 Unspecified atrial fibrillation: Secondary | ICD-10-CM

## 2011-04-17 DIAGNOSIS — I2510 Atherosclerotic heart disease of native coronary artery without angina pectoris: Secondary | ICD-10-CM

## 2011-04-17 DIAGNOSIS — I10 Essential (primary) hypertension: Secondary | ICD-10-CM

## 2011-04-17 DIAGNOSIS — I25810 Atherosclerosis of coronary artery bypass graft(s) without angina pectoris: Secondary | ICD-10-CM

## 2011-04-17 LAB — POCT INR: INR: 2.5

## 2011-04-17 NOTE — Patient Instructions (Addendum)
Your physician recommends that you return for a FASTING lipid profile/liver profile 05/15/11  401.9  414.05   Take and record you blood pressure. I will call you in 2 weeks to get the readings. Luana Shu  Your physician wants you to follow-up in: 4 months with Dr Shirlee Latch. You will receive a reminder letter in the mail two months in advance. If you don't receive a letter, please call our office to schedule the follow-up appointment.

## 2011-04-17 NOTE — Progress Notes (Addendum)
PCP: Dr. Debby Bud  77 yo with history of CAD s/p CABG, chronic atrial fibrillation, pacemaker, CKD, and dementia presents for cardiology followup.  He has been doing well symptomatically.  He exercises 3 days a week and walks a fair amount daily.  No exertional dyspnea or chest pain.  No syncope/presyncope.  He continues to have short-term memory difficulty and has been using an Exelon patch.  He continues in permanent atrial fibrillation.  SBP is quite high today but he has had multiple back-to-back medical appointments today and feels stressed.  His wife takes his BP at home and says it tends to run in the 130s-140s systolic.  Echo in 4/12 showed EF 50% with a mild to moderately dilated RV.   Labs (3/11): LDL 62, HDL 60 Labs (10/11): creatinine 1.44 Labs (1/12): creatinine 1.46, LFTs normal Labs (3/12): creatinine 1.4 Labs (4/12): LDL 61, HDL 48  PMH: 1. CAD: CABG 7829.  Myoview in 1/09 with no ischemia.  Echo (4/12): EF 50%, inferobasal hypokinesis, mild MR, mild to moderate RV dilation with mild systolic dysfunction.  2. Atrial fibrillation: Permanent. On warfarin. 3. 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  4. Dementia 5. BPH 6. Nephrolithiasis 7. HTN 8. Hyperlipidemia 9. CKD 10. Prostate cancer: Treated with radiation in 1991.  11. Gout  SH: Lives with wife at [facility] in [place].  2 sons, 4 daughters.  Retired.  Rare ETOH.  Quit tobacco 1976.   FH: Heart disease.  Mother with dementia.   ROS: All systems reviewed and negative except as per HPI.   Current Outpatient Prescriptions  Medication Sig Dispense Refill  . aspirin 81 MG tablet Take 81 mg by mouth daily.        Marland Kitchen atorvastatin (LIPITOR) 20 MG tablet 1/2 tablet daily       . benazepril (LOTENSIN) 40 MG tablet 1/2 tab po qd       . INVESTIGATIONAL DRUG SIMPLE RECORD        . metoprolol (LOPRESSOR) 50 MG tablet Take 1 tablet (50 mg total) by mouth 2 (two) times daily.  180 tablet  3  .  nitroGLYCERIN (NITROSTAT) 0.4 MG SL tablet Place 0.4 mg under the tongue every 5 (five) minutes as needed.        . rivastigmine (EXELON) 4.6 mg/24hr Place 1 patch onto the skin daily.        . sildenafil (VIAGRA) 100 MG tablet Take 100 mg by mouth as directed.        . warfarin (COUMADIN) 2.5 MG tablet Take daily as directed by the anticoagulation clinic.  45 tablet  2  . DISCONTD: amLODipine (NORVASC) 2.5 MG tablet Take 1 tablet (2.5 mg total) by mouth daily.  90 tablet  3  . amLODipine (NORVASC) 2.5 MG tablet Take 1 tablet (2.5 mg total) by mouth daily.        BP 138/90  Pulse 50  Ht 6\' 2"  (1.88 m)  Wt 204 lb (92.534 kg)  BMI 26.19 kg/m2 General: NAD Neck: JVP 7 cm, no thyromegaly or thyroid nodule.  Lungs: Clear to auscultation bilaterally with normal respiratory effort. CV: Nondisplaced PMI.  Heart irregular S1/S2, no S3/S4, 1/6 SEM.  No edema.  No carotid bruit.  Normal pedal pulses.  Abdomen: Soft, nontender, no hepatosplenomegaly, no distention.   Neurologic: Alert and oriented x 3.  Short term memory difficulty. Psych: Normal affect. Extremities: No clubbing or cyanosis.

## 2011-04-17 NOTE — Assessment & Plan Note (Signed)
Stable with no ischemic symptoms.  Continue ASA 81, statin, ACEI, and metoprolol.

## 2011-04-17 NOTE — Assessment & Plan Note (Signed)
Permanent.  He is steady on his feet.  Continue warfarin and metoprolol.

## 2011-04-17 NOTE — Assessment & Plan Note (Signed)
BP quite high today but may be related to stress.  Has been running borderline elevated at home.  I talked with his wife today about increasing amlodipine to 5 mg daily.  She wants to check his BP daily for the next 2 weeks to see what it runs over a period of time.  We will call in two weeks to see what it is running.

## 2011-04-17 NOTE — Assessment & Plan Note (Signed)
Lipids/LFTs today with goal LDL < 70.

## 2011-05-01 ENCOUNTER — Telehealth: Payer: Self-pay | Admitting: *Deleted

## 2011-05-01 NOTE — Telephone Encounter (Signed)
05/01/11 I talked with wife about recent BP readings. 04/24/11 138/73  04/27/11 137/79  04/19/11 140/79. Pt feels good. I will forward to Dr Shirlee Latch for review.

## 2011-05-01 NOTE — Telephone Encounter (Signed)
BP upper normal.  Can continue current regimen for now.  Let me know if SBP starts running consistently above 140.

## 2011-05-01 NOTE — Telephone Encounter (Signed)
HYPERTENSION - Marca Ancona, MD 04/17/2011 11:51 PM Signed  BP quite high today but may be related to stress. Has been running borderline elevated at home. I talked with his wife today about increasing amlodipine to 5 mg daily. She wants to check his BP daily for the next 2 weeks to see what it runs over a period of time. We will call in two weeks to see what it is running.

## 2011-05-02 NOTE — Telephone Encounter (Signed)
I talked with pt's wife. She is aware of Dr Alford Highland recommendation.

## 2011-05-06 ENCOUNTER — Telehealth: Payer: Self-pay | Admitting: Cardiology

## 2011-05-07 MED ORDER — WARFARIN SODIUM 2.5 MG PO TABS: ORAL_TABLET | ORAL | Status: DC

## 2011-05-07 NOTE — Telephone Encounter (Signed)
Pt needs refill sent to [facility].

## 2011-05-15 ENCOUNTER — Other Ambulatory Visit (INDEPENDENT_AMBULATORY_CARE_PROVIDER_SITE_OTHER): Payer: Medicare Other | Admitting: *Deleted

## 2011-05-15 ENCOUNTER — Ambulatory Visit (INDEPENDENT_AMBULATORY_CARE_PROVIDER_SITE_OTHER): Payer: Medicare Other | Admitting: *Deleted

## 2011-05-15 DIAGNOSIS — I25810 Atherosclerosis of coronary artery bypass graft(s) without angina pectoris: Secondary | ICD-10-CM

## 2011-05-15 DIAGNOSIS — I10 Essential (primary) hypertension: Secondary | ICD-10-CM

## 2011-05-15 DIAGNOSIS — I4891 Unspecified atrial fibrillation: Secondary | ICD-10-CM

## 2011-05-15 LAB — HEPATIC FUNCTION PANEL
ALT: 24 U/L (ref 0–53)
AST: 24 U/L (ref 0–37)
Albumin: 4.1 g/dL (ref 3.5–5.2)
Total Bilirubin: 0.8 mg/dL (ref 0.3–1.2)
Total Protein: 7.2 g/dL (ref 6.0–8.3)

## 2011-05-15 LAB — LIPID PANEL
Cholesterol: 109 mg/dL (ref 0–200)
HDL: 45.8 mg/dL (ref >39.00)

## 2011-05-15 LAB — POCT INR: INR: 2.9

## 2011-05-21 ENCOUNTER — Telehealth: Payer: Self-pay | Admitting: Cardiology

## 2011-05-21 NOTE — Telephone Encounter (Signed)
I talked with pt's wife about recent lipid results

## 2011-05-21 NOTE — Telephone Encounter (Signed)
Pt returning call to Anne. Please call back.  

## 2011-05-22 ENCOUNTER — Encounter: Payer: Self-pay | Admitting: Internal Medicine

## 2011-05-22 ENCOUNTER — Ambulatory Visit (INDEPENDENT_AMBULATORY_CARE_PROVIDER_SITE_OTHER): Payer: Medicare Other | Admitting: Internal Medicine

## 2011-05-22 DIAGNOSIS — Z950 Presence of cardiac pacemaker: Secondary | ICD-10-CM

## 2011-05-22 DIAGNOSIS — I495 Sick sinus syndrome: Secondary | ICD-10-CM

## 2011-05-22 DIAGNOSIS — I10 Essential (primary) hypertension: Secondary | ICD-10-CM

## 2011-05-22 DIAGNOSIS — I4891 Unspecified atrial fibrillation: Secondary | ICD-10-CM

## 2011-05-22 NOTE — Progress Notes (Signed)
The patient presents today for routine electrophysiology followup.  Since last being seen by Dr Juanda Chance, the patient reports doing very well.  Today, he denies symptoms of palpitations, chest pain, shortness of breath, orthopnea, PND, lower extremity edema, dizziness, presyncope, syncope, or neurologic sequela.  The patient feels that he is tolerating medications without difficulties and is otherwise without complaint today.   Past Medical History  Diagnosis Date  . Atrial fibrillation     permanent  . Coronary atherosclerosis of artery bypass graft 1991  . Hypertension   . Hyperlipidemia   . Prostate cancer   . Renal insufficiency   . Gout   . Tachycardia-bradycardia     s/p PPM by Dr Juanda Chance   Past Surgical History  Procedure Date  . Coronary artery bypass graft 1991  . Colonoscopy   . Pacemaker insertion     SJM implanted by Dr Juanda Chance, most recent generator 2007  . Tonsillectomy   . Inguinal hernia repair   . Sebaceous cyst excision  2005, 2008  . Spermatocelectomy     Current Outpatient Prescriptions  Medication Sig Dispense Refill  . amLODipine (NORVASC) 2.5 MG tablet Take 1 tablet (2.5 mg total) by mouth daily.      Marland Kitchen aspirin 81 MG tablet Take 81 mg by mouth daily.        Marland Kitchen atorvastatin (LIPITOR) 20 MG tablet 1/2 tablet daily       . benazepril (LOTENSIN) 40 MG tablet 1/2 tab po qd       . INVESTIGATIONAL DRUG SIMPLE RECORD        . metoprolol (LOPRESSOR) 50 MG tablet Take 1 tablet (50 mg total) by mouth 2 (two) times daily.  180 tablet  3  . nitroGLYCERIN (NITROSTAT) 0.4 MG SL tablet Place 0.4 mg under the tongue every 5 (five) minutes as needed.        . rivastigmine (EXELON) 4.6 mg/24hr Place 1 patch onto the skin daily.        . sildenafil (VIAGRA) 100 MG tablet Take 100 mg by mouth as directed.        . warfarin (COUMADIN) 2.5 MG tablet Take daily as directed by the anticoagulation clinic.  100 tablet  0    Allergies  Allergen Reactions  . Amoxicillin    REACTION: unspecified  . Sulfamethoxazole     REACTION: unspecified  . Tetanus Toxoid Adsorbed     REACTION: unspecified    History   Social History  . Marital Status: Married    Spouse Name: N/A    Number of Children: N/A  . Years of Education: N/A   Occupational History  . Not on file.   Social History Main Topics  . Smoking status: Former Games developer  . Smokeless tobacco: Not on file  . Alcohol Use: No  . Drug Use: No  . Sexually Active: Not on file   Other Topics Concern  . Not on file   Social History Narrative   Married 1953[place] Maxie Barb MBASales for [employer]-retired 11'9 sons-'60, '71; 4 daughters- '54, '56, '57, '65; 11 grandchildrenEnd-of-life: has a living will    Physical Exam: Filed Vitals:   05/22/11 0927  BP: 153/90  Pulse: 50  Height: 6\' 1"  (1.854 m)  Weight: 203 lb (92.08 kg)    GEN- The patient is well appearing, alert and oriented x 3 today.   Head- normocephalic, atraumatic Eyes-  Sclera clear, conjunctiva pink Ears- hearing intact Oropharynx- clear Neck- supple, no JVP Lymph- no cervical lymphadenopathy  Lungs- Clear to ausculation bilaterally, normal work of breathing Chest- pacemaker pocket is well healed Heart- Regular rate and rhythm, no murmurs, rubs or gallops, PMI not laterally displaced GI- soft, NT, ND, + BS Extremities- no clubbing, cyanosis, or edema  Pacemaker interrogation- reviewed in detail today,  See PACEART report  Assessment and Plan:

## 2011-05-22 NOTE — Assessment & Plan Note (Signed)
Above goal today, though pt reports good control at home No changes today

## 2011-05-22 NOTE — Patient Instructions (Signed)
Your physician wants you to follow-up in: 6 months with device clinic and 12 months with Dr Allred You will receive a reminder letter in the mail two months in advance. If you don't receive a letter, please call our office to schedule the follow-up appointment.  

## 2011-05-22 NOTE — Assessment & Plan Note (Signed)
Normal pacemaker function See Arita Miss Art report No changes today  Return to the device clinic in 6 months I will see again in 12 months

## 2011-05-22 NOTE — Assessment & Plan Note (Signed)
Rate controlled permanent afib Continue coumadin long term

## 2011-06-26 ENCOUNTER — Ambulatory Visit (INDEPENDENT_AMBULATORY_CARE_PROVIDER_SITE_OTHER): Payer: Medicare Other | Admitting: *Deleted

## 2011-06-26 DIAGNOSIS — I4891 Unspecified atrial fibrillation: Secondary | ICD-10-CM

## 2011-06-26 LAB — POCT INR: INR: 2.6

## 2011-07-04 ENCOUNTER — Encounter: Payer: Self-pay | Admitting: Internal Medicine

## 2011-07-22 ENCOUNTER — Encounter: Payer: Self-pay | Admitting: Cardiology

## 2011-07-22 ENCOUNTER — Ambulatory Visit (INDEPENDENT_AMBULATORY_CARE_PROVIDER_SITE_OTHER): Payer: Medicare Other | Admitting: Cardiology

## 2011-07-22 DIAGNOSIS — I4891 Unspecified atrial fibrillation: Secondary | ICD-10-CM

## 2011-07-22 DIAGNOSIS — I10 Essential (primary) hypertension: Secondary | ICD-10-CM

## 2011-07-22 DIAGNOSIS — E785 Hyperlipidemia, unspecified: Secondary | ICD-10-CM

## 2011-07-22 DIAGNOSIS — I2510 Atherosclerotic heart disease of native coronary artery without angina pectoris: Secondary | ICD-10-CM

## 2011-07-22 NOTE — Assessment & Plan Note (Signed)
LDL at goal (< 70) when last checked.  

## 2011-07-22 NOTE — Assessment & Plan Note (Signed)
Permanent atrial fibrillation.  Stable on coumadin with no falls.  He can stop ASA.

## 2011-07-22 NOTE — Assessment & Plan Note (Signed)
Stable with no ischemic symptoms.  Continue statin, ACEI, and metoprolol.  Can stop ASA as he is on coumadin.

## 2011-07-22 NOTE — Progress Notes (Signed)
PCP: Dr. Debby Bud  78 yo with history of CAD s/p CABG, chronic atrial fibrillation, pacemaker, CKD, and dementia presents for cardiology followup.  He exercises 3 days a week and walks a fair amount daily.  No exertional dyspnea or chest pain.  No syncope/presyncope.  He continues to have short-term memory difficulty and has been using an Exelon patch.  He has a rash on trunk, and his wife is concerned that it is related to Exelon.  He continues in permanent atrial fibrillation.  BP is mildly elevated today but his wife says that it is always within normal limits when she checks it at home.  He had an upper respiratory infection with cough for about 2 weeks. No fever.  This is now getting better.  Weight is down 6 lbs since last appointment.   ECG: atrial fibrillation, V-paced.    Labs (3/11): LDL 62, HDL 60 Labs (10/11): creatinine 1.44 Labs (1/12): creatinine 1.46, LFTs normal Labs (3/12): creatinine 1.4 Labs (4/12): LDL 61, HDL 48 Labs (10/12): LDL 48, HDL 46, LFTs normal  PMH: 1. CAD: CABG 1992.  Myoview in 1/09 with no ischemia.  Echo (4/12): EF 50%, inferobasal hypokinesis, mild MR, mild to moderate RV dilation with mild systolic dysfunction.  2. Atrial fibrillation: Permanent. On warfarin. 3. 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  4. Dementia 5. BPH 6. Nephrolithiasis 7. HTN 8. Hyperlipidemia 9. CKD 10. Prostate cancer: Treated with radiation in 1991.  11. Gout  SH: Lives with wife at [facility] in [place].  2 sons, 4 daughters.  Retired.  Rare ETOH.  Quit tobacco 1976.   FH: Heart disease.  Mother with dementia.    Current Outpatient Prescriptions  Medication Sig Dispense Refill  . amLODipine (NORVASC) 2.5 MG tablet Take 1 tablet (2.5 mg total) by mouth daily.      Marland Kitchen aspirin 81 MG tablet Take 81 mg by mouth daily.        Marland Kitchen atorvastatin (LIPITOR) 20 MG tablet 1/2 tablet daily       . benazepril (LOTENSIN) 40 MG tablet 1/2 tab po qd       .  metoprolol (LOPRESSOR) 50 MG tablet Take 1 tablet (50 mg total) by mouth 2 (two) times daily.  180 tablet  3  . nitroGLYCERIN (NITROSTAT) 0.4 MG SL tablet Place 0.4 mg under the tongue every 5 (five) minutes as needed.        . rivastigmine (EXELON) 4.6 mg/24hr Place 1 patch onto the skin daily.        . sildenafil (VIAGRA) 100 MG tablet Take 100 mg by mouth as directed.        . warfarin (COUMADIN) 2.5 MG tablet Take daily as directed by the anticoagulation clinic.  100 tablet  0    BP 146/84  Pulse 51  Ht 6\' 1"  (1.854 m)  Wt 89.812 kg (198 lb)  BMI 26.12 kg/m2 General: NAD Neck: JVP 7 cm, no thyromegaly or thyroid nodule.  Lungs: Clear to auscultation bilaterally with normal respiratory effort. CV: Nondisplaced PMI.  Heart irregular S1/S2, no S3/S4, 1/6 SEM.  No edema.  No carotid bruit.  Normal pedal pulses.  Abdomen: Soft, nontender, no hepatosplenomegaly, no distention.   Neurologic: Alert and oriented x 3.  Short term memory difficulty. Psych: Normal affect. Extremities: No clubbing or cyanosis.

## 2011-07-22 NOTE — Patient Instructions (Signed)
Your physician wants you to follow-up in: 6 months with Dr McLean.(July 2013). You will receive a reminder letter in the mail two months in advance. If you don't receive a letter, please call our office to schedule the follow-up appointment.  

## 2011-07-22 NOTE — Assessment & Plan Note (Signed)
BP mildly elevated but is normal at home.  Will not change meds.  He has had a cough x 2 weeks that is now getting better.  Lungs clear, no volume overload.  Suspect viral URI.

## 2011-07-24 ENCOUNTER — Other Ambulatory Visit: Payer: Self-pay | Admitting: Cardiology

## 2011-07-24 MED ORDER — WARFARIN SODIUM 2.5 MG PO TABS: ORAL_TABLET | ORAL | Status: DC

## 2011-08-07 ENCOUNTER — Ambulatory Visit (INDEPENDENT_AMBULATORY_CARE_PROVIDER_SITE_OTHER): Payer: Medicare Other | Admitting: Pharmacist

## 2011-08-07 DIAGNOSIS — I4891 Unspecified atrial fibrillation: Secondary | ICD-10-CM

## 2011-08-07 LAB — POCT INR: INR: 2.7

## 2011-09-12 ENCOUNTER — Encounter: Payer: Self-pay | Admitting: Cardiology

## 2011-09-12 ENCOUNTER — Encounter (INDEPENDENT_AMBULATORY_CARE_PROVIDER_SITE_OTHER): Payer: Medicare Other

## 2011-09-12 DIAGNOSIS — R0989 Other specified symptoms and signs involving the circulatory and respiratory systems: Secondary | ICD-10-CM

## 2011-09-18 ENCOUNTER — Ambulatory Visit (INDEPENDENT_AMBULATORY_CARE_PROVIDER_SITE_OTHER): Payer: Medicare Other | Admitting: *Deleted

## 2011-09-18 DIAGNOSIS — I4891 Unspecified atrial fibrillation: Secondary | ICD-10-CM

## 2011-10-15 ENCOUNTER — Other Ambulatory Visit: Payer: Self-pay | Admitting: Cardiology

## 2011-10-29 ENCOUNTER — Ambulatory Visit (INDEPENDENT_AMBULATORY_CARE_PROVIDER_SITE_OTHER): Payer: Medicare Other

## 2011-10-29 DIAGNOSIS — I4891 Unspecified atrial fibrillation: Secondary | ICD-10-CM

## 2011-11-18 ENCOUNTER — Ambulatory Visit (INDEPENDENT_AMBULATORY_CARE_PROVIDER_SITE_OTHER): Payer: Medicare Other | Admitting: *Deleted

## 2011-11-18 ENCOUNTER — Encounter: Payer: Self-pay | Admitting: Internal Medicine

## 2011-11-18 DIAGNOSIS — I495 Sick sinus syndrome: Secondary | ICD-10-CM

## 2011-11-18 DIAGNOSIS — I4891 Unspecified atrial fibrillation: Secondary | ICD-10-CM

## 2011-11-18 LAB — PACEMAKER DEVICE OBSERVATION
BATTERY VOLTAGE: 2.78 V
DEVICE MODEL PM: 1154839
RV LEAD AMPLITUDE: 5.3 mv
VENTRICULAR PACING PM: 51

## 2011-11-18 NOTE — Progress Notes (Signed)
Pacer check in clinic  

## 2011-11-19 ENCOUNTER — Other Ambulatory Visit: Payer: Self-pay | Admitting: Cardiology

## 2011-12-10 ENCOUNTER — Ambulatory Visit (INDEPENDENT_AMBULATORY_CARE_PROVIDER_SITE_OTHER): Payer: Medicare Other | Admitting: *Deleted

## 2011-12-10 DIAGNOSIS — I4891 Unspecified atrial fibrillation: Secondary | ICD-10-CM

## 2011-12-10 LAB — POCT INR: INR: 2.6

## 2011-12-24 ENCOUNTER — Other Ambulatory Visit: Payer: Self-pay | Admitting: Cardiology

## 2011-12-24 MED ORDER — ATORVASTATIN CALCIUM 20 MG PO TABS: 10.0000 mg | ORAL_TABLET | Freq: Every day | ORAL | Status: DC

## 2012-01-01 ENCOUNTER — Telehealth: Payer: Self-pay | Admitting: Cardiology

## 2012-01-01 ENCOUNTER — Other Ambulatory Visit: Payer: Self-pay

## 2012-01-01 MED ORDER — ATORVASTATIN CALCIUM 10 MG PO TABS: 10.0000 mg | ORAL_TABLET | Freq: Every day | ORAL | Status: DC

## 2012-01-01 NOTE — Telephone Encounter (Signed)
error 

## 2012-01-06 ENCOUNTER — Other Ambulatory Visit: Payer: Self-pay | Admitting: *Deleted

## 2012-01-06 MED ORDER — ATORVASTATIN CALCIUM 10 MG PO TABS: 10.0000 mg | ORAL_TABLET | Freq: Every day | ORAL | Status: DC

## 2012-01-21 ENCOUNTER — Encounter: Payer: Self-pay | Admitting: Cardiology

## 2012-01-21 ENCOUNTER — Ambulatory Visit (INDEPENDENT_AMBULATORY_CARE_PROVIDER_SITE_OTHER): Payer: Medicare Other | Admitting: *Deleted

## 2012-01-21 ENCOUNTER — Ambulatory Visit (INDEPENDENT_AMBULATORY_CARE_PROVIDER_SITE_OTHER): Payer: Medicare Other | Admitting: Cardiology

## 2012-01-21 VITALS — BP 166/93 | HR 50 | Ht 73.0 in | Wt 200.0 lb

## 2012-01-21 DIAGNOSIS — I10 Essential (primary) hypertension: Secondary | ICD-10-CM

## 2012-01-21 DIAGNOSIS — I4891 Unspecified atrial fibrillation: Secondary | ICD-10-CM

## 2012-01-21 DIAGNOSIS — I2510 Atherosclerotic heart disease of native coronary artery without angina pectoris: Secondary | ICD-10-CM

## 2012-01-21 DIAGNOSIS — E785 Hyperlipidemia, unspecified: Secondary | ICD-10-CM

## 2012-01-21 MED ORDER — METOPROLOL TARTRATE 50 MG PO TABS: 50.0000 mg | ORAL_TABLET | Freq: Two times a day (BID) | ORAL | Status: DC

## 2012-01-21 MED ORDER — DOXAZOSIN MESYLATE 2 MG PO TABS: 2.0000 mg | ORAL_TABLET | Freq: Every day | ORAL | Status: DC

## 2012-01-21 NOTE — Assessment & Plan Note (Signed)
Stable with no ischemic symptoms.  Continue statin, ACEI, and metoprolol.  Can stop ASA as he is on coumadin.  

## 2012-01-21 NOTE — Assessment & Plan Note (Signed)
Permanent atrial fibrillation.  Stable on coumadin with no falls.  He can stop ASA.   

## 2012-01-21 NOTE — Progress Notes (Signed)
Patient ID: Spencer Jones, male   DOB: 02/11/1925, 78 y.o.   MRN: [id number] PCP: Dr. Debby Bud  78 yo with history of CAD s/p CABG, chronic atrial fibrillation, pacemaker, CKD, and dementia presents for cardiology followup.  He exercises 3 days a week and walks a fair amount daily.  No exertional dyspnea or chest pain.  No syncope/presyncope.  He continues to have short-term memory difficulty which is stable.  He is in permanent atrial fibrillation.  BP is mildly elevated today but his wife says that it is always within normal limits when she checks it at home. Main complaint is nocturia, "dribbling" and weak urinary stream.  ECG: atrial fibrillation, V-paced.    Labs (3/11): LDL 62, HDL 60 Labs (10/11): creatinine 1.44 Labs (1/12): creatinine 1.46, LFTs normal Labs (3/12): creatinine 1.4 Labs (4/12): LDL 61, HDL 48 Labs (10/12): LDL 48, HDL 46, LFTs normal Labs (4/09): Total cholesterol 115, creatinine 1.37  PMH: 1. CAD: CABG 1992.  Myoview in 1/09 with no ischemia.  Echo (4/12): EF 50%, inferobasal hypokinesis, mild MR, mild to moderate RV dilation with mild systolic dysfunction.  2. Atrial fibrillation: Permanent. On warfarin. 3. 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  4. Dementia 5. BPH 6. Nephrolithiasis 7. HTN 8. Hyperlipidemia 9. CKD 10. Prostate cancer: Treated with radiation in 1991.  11. Gout  SH: Lives with wife at [facility] in [place].  2 sons, 4 daughters.  Retired.  Rare ETOH.  Quit tobacco 1976.   FH: Heart disease.  Mother with dementia.   ROS: All systems reviewed and negative except as per HPI.    Current Outpatient Prescriptions  Medication Sig Dispense Refill  . amLODipine (NORVASC) 2.5 MG tablet TAKE 1 TABLET DAILY  90 tablet  2  . atorvastatin (LIPITOR) 10 MG tablet Take 1 tablet (10 mg total) by mouth daily.  90 tablet  3  . benazepril (LOTENSIN) 40 MG tablet 1/2 tab po qd       . galantamine (RAZADYNE ER) 8 MG 24 hr capsule  Take 1 tablet by mouth Daily.      . metoprolol (LOPRESSOR) 50 MG tablet Take 1 tablet (50 mg total) by mouth 2 (two) times daily.  180 tablet  3  . nitroGLYCERIN (NITROSTAT) 0.4 MG SL tablet Place 0.4 mg under the tongue every 5 (five) minutes as needed.        . sildenafil (VIAGRA) 100 MG tablet Take 100 mg by mouth as directed.        . warfarin (COUMADIN) 2.5 MG tablet TAKE DAILY AS DIRECTED BY THE ANTICOAGULATION CLINIC  120 tablet  1  . DISCONTD: metoprolol (LOPRESSOR) 50 MG tablet Take 1 tablet (50 mg total) by mouth 2 (two) times daily.  180 tablet  3  . doxazosin (CARDURA) 2 MG tablet Take 1 tablet (2 mg total) by mouth at bedtime.  30 tablet  6    BP 166/93  Pulse 50  Ht 6\' 1"  (1.854 m)  Wt 90.719 kg (200 lb)  BMI 26.39 kg/m2 General: NAD Neck: JVP 7 cm, no thyromegaly or thyroid nodule.  Lungs: Clear to auscultation bilaterally with normal respiratory effort. CV: Nondisplaced PMI.  Heart irregular S1/S2, no S3/S4, 2/6 SEM.  No edema.  No carotid bruit.  Normal pedal pulses.  Abdomen: Soft, nontender, no hepatosplenomegaly, no distention.   Neurologic: Alert and oriented x 3.  Short term memory difficulty. Psych: Normal affect. Extremities: No clubbing or cyanosis.

## 2012-01-21 NOTE — Patient Instructions (Addendum)
Stop aspirin.  Start Doxazosin 2mg  daily at bedtime.  Take and record your blood pressure daily. I will call you in about a week to get the readings. Luana Shu [phone]  Your physician wants you to follow-up in: 6 months with Dr Shirlee Latch. (January 2014). You will receive a reminder letter in the mail two months in advance. If you don't receive a letter, please call our office to schedule the follow-up appointment.

## 2012-01-21 NOTE — Assessment & Plan Note (Signed)
BP high today though tends to be ok at home.  Has significant BPH symptoms, nocturia is bothersome.  I will add doxazosin 2 mg daily to help with both BPH symptoms and BP.

## 2012-01-21 NOTE — Assessment & Plan Note (Signed)
LDL at goal (< 70) when last checked.  

## 2012-01-28 ENCOUNTER — Other Ambulatory Visit: Payer: Self-pay | Admitting: Cardiology

## 2012-01-28 ENCOUNTER — Telehealth: Payer: Self-pay | Admitting: *Deleted

## 2012-01-28 NOTE — Telephone Encounter (Signed)
Pt's wife notified.

## 2012-01-28 NOTE — Telephone Encounter (Signed)
BP high today though tends to be ok at home. Has significant BPH symptoms, nocturia is bothersome. I will add doxazosin 2 mg daily to help with both BPH symptoms and BP.   01/28/12 Spoke with pt's wife. Recent BP readings since starting doxazosin: 130/69 143/72 133/66 130/68 127/78. Wife states pt denies lightheadedness or dizziness. She does feel pt's nocturia has improved since starting doxazosin. I will forward to Dr Shirlee Latch for review.

## 2012-01-28 NOTE — Telephone Encounter (Signed)
That is fine, no changes.  

## 2012-02-14 ENCOUNTER — Telehealth: Payer: Self-pay | Admitting: Cardiology

## 2012-02-14 MED ORDER — DOXAZOSIN MESYLATE 2 MG PO TABS: 2.0000 mg | ORAL_TABLET | Freq: Every day | ORAL | Status: DC

## 2012-02-14 NOTE — Telephone Encounter (Signed)
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

## 2012-02-14 NOTE — Telephone Encounter (Signed)
Spoke with wife. She states pt tolerating this doxazosin well and it seems to be helping his " dribbling". Prescription has been sent in to [facility].

## 2012-03-03 ENCOUNTER — Ambulatory Visit (INDEPENDENT_AMBULATORY_CARE_PROVIDER_SITE_OTHER): Payer: Medicare Other | Admitting: Pharmacist

## 2012-03-03 DIAGNOSIS — I4891 Unspecified atrial fibrillation: Secondary | ICD-10-CM

## 2012-04-14 ENCOUNTER — Ambulatory Visit (INDEPENDENT_AMBULATORY_CARE_PROVIDER_SITE_OTHER): Payer: Medicare Other | Admitting: *Deleted

## 2012-04-14 DIAGNOSIS — I4891 Unspecified atrial fibrillation: Secondary | ICD-10-CM

## 2012-05-26 ENCOUNTER — Ambulatory Visit (INDEPENDENT_AMBULATORY_CARE_PROVIDER_SITE_OTHER): Payer: Medicare Other | Admitting: Pharmacist

## 2012-05-26 DIAGNOSIS — I4891 Unspecified atrial fibrillation: Secondary | ICD-10-CM

## 2012-06-02 ENCOUNTER — Other Ambulatory Visit: Payer: Self-pay | Admitting: Cardiology

## 2012-07-28 ENCOUNTER — Ambulatory Visit (INDEPENDENT_AMBULATORY_CARE_PROVIDER_SITE_OTHER): Payer: Medicare Other | Admitting: Surgery

## 2012-07-28 ENCOUNTER — Encounter (INDEPENDENT_AMBULATORY_CARE_PROVIDER_SITE_OTHER): Payer: Self-pay | Admitting: Surgery

## 2012-07-28 VITALS — BP 128/72 | HR 50 | Temp 98.6°F | Resp 14 | Ht 73.0 in | Wt 200.6 lb

## 2012-07-28 DIAGNOSIS — L723 Sebaceous cyst: Secondary | ICD-10-CM

## 2012-07-28 NOTE — Patient Instructions (Signed)

## 2012-07-28 NOTE — Progress Notes (Signed)
Subjective:     Patient ID: Spencer Jones, male   DOB: 11/17/1924, 79 y.o.   MRN: [id number]  HPI Patient presents with a cyst on back. He has had a history of an epidermal inclusion cyst which ruptured in the same spot many years ago. Is not currently tender or draining or red.  Review of Systems  Constitutional: Negative for fever and chills.       Objective:   Physical Exam  Constitutional: He is oriented to person, place, and time. He appears well-developed and well-nourished.  Neck: Normal range of motion. Neck supple.  Neurological: He is alert and oriented to person, place, and time.  Skin:          Assessment:     Epidermal inclusion cyst not infected or draining.    Plan:     Observe for now.

## 2012-08-04 ENCOUNTER — Telehealth: Payer: Self-pay | Admitting: *Deleted

## 2012-08-04 ENCOUNTER — Telehealth: Payer: Self-pay | Admitting: Cardiovascular Disease

## 2012-08-04 ENCOUNTER — Ambulatory Visit: Payer: Self-pay | Admitting: Cardiovascular Disease

## 2012-08-04 ENCOUNTER — Telehealth: Payer: Self-pay | Admitting: Cardiology

## 2012-08-04 DIAGNOSIS — I4891 Unspecified atrial fibrillation: Secondary | ICD-10-CM

## 2012-08-04 NOTE — Telephone Encounter (Signed)
Spoke with pt's daughter

## 2012-08-04 NOTE — Telephone Encounter (Signed)
Telephoned daughter and she states pt has not been here lately because he is following with Dr Lowell Guitar at [facility] for coumadin management and she has made contact with them about the double dosing.

## 2012-08-04 NOTE — Telephone Encounter (Signed)
Pt very concerned because her father has doubled all his medication this morning

## 2012-08-04 NOTE — Telephone Encounter (Signed)
Reviewed with Dr Excell Seltzer. Push fluids, monitor BP, heart rate and symptoms. Report to ED if pt becomes symptomatic.

## 2012-08-28 ENCOUNTER — Ambulatory Visit: Payer: Medicare Other | Admitting: Cardiology

## 2012-09-11 ENCOUNTER — Encounter: Payer: Self-pay | Admitting: Neurology

## 2012-09-11 DIAGNOSIS — G609 Hereditary and idiopathic neuropathy, unspecified: Secondary | ICD-10-CM | POA: Insufficient documentation

## 2012-09-11 DIAGNOSIS — I509 Heart failure, unspecified: Secondary | ICD-10-CM | POA: Insufficient documentation

## 2012-10-06 ENCOUNTER — Encounter: Payer: Self-pay | Admitting: Neurology

## 2012-10-06 ENCOUNTER — Ambulatory Visit (INDEPENDENT_AMBULATORY_CARE_PROVIDER_SITE_OTHER): Payer: BC Managed Care – PPO | Admitting: Neurology

## 2012-10-06 VITALS — BP 143/70 | HR 50 | Temp 98.3°F | Ht 72.0 in | Wt 195.0 lb

## 2012-10-06 DIAGNOSIS — F03918 Unspecified dementia, unspecified severity, with other behavioral disturbance: Secondary | ICD-10-CM

## 2012-10-06 DIAGNOSIS — F0280 Dementia in other diseases classified elsewhere without behavioral disturbance: Secondary | ICD-10-CM

## 2012-10-06 DIAGNOSIS — G309 Alzheimer's disease, unspecified: Secondary | ICD-10-CM | POA: Insufficient documentation

## 2012-10-06 DIAGNOSIS — G609 Hereditary and idiopathic neuropathy, unspecified: Secondary | ICD-10-CM

## 2012-10-06 DIAGNOSIS — I4891 Unspecified atrial fibrillation: Secondary | ICD-10-CM

## 2012-10-06 DIAGNOSIS — F0391 Unspecified dementia with behavioral disturbance: Secondary | ICD-10-CM | POA: Insufficient documentation

## 2012-10-06 MED ORDER — QUETIAPINE FUMARATE 25 MG PO TABS: ORAL_TABLET | ORAL | Status: DC
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
## 2012-10-06 NOTE — Patient Instructions (Addendum)
I think overall you are doing fairly well but I do want to suggest a few things today:  Remember to drink plenty of fluid, eat healthy meals and do not skip any meals. Try to eat protein with a every meal and eat a healthy snack such as fruit or nuts in between meals. Try to keep a regular sleep-wake schedule and try to exercise daily, particularly in the form of walking, 20-30 minutes a day.   Engage in social activities in your community and your family and try to keep up with current events by reading the newspaper or watching the news.  As far as her medications are concerned, I would like to suggest a trial of low dose Seroquel to help with your agitation and unrest. We will start at 25 mg strength, 1/2 pill each night at 6 or 7 PM. I would discontinue Risperdal.   I would like to see you back in 3 months, sooner if the need arises. Please call us with any interim questions, concerns, problems, updates or refill requests.  Please also call us for any test results so we can go over those with you on the phone. Brett Canales is my clinical assistant and will answer any of your questions and relay your messages to me.  Our phone number is [phone]. We also have an after hours call service as well and is a physician on-call for urgent questions. For any emergencies you know to call 911 or go to the emergency room.

## 2012-10-15 ENCOUNTER — Telehealth: Payer: Self-pay | Admitting: Internal Medicine

## 2012-10-15 NOTE — Telephone Encounter (Signed)
10-15-12 lmm @434pm  for dtr kathy crawford to set up may pacer ck with allred/mt

## 2012-12-08 ENCOUNTER — Telehealth: Payer: Self-pay | Admitting: Neurology

## 2012-12-10 ENCOUNTER — Telehealth: Payer: Self-pay | Admitting: Neurology

## 2012-12-10 ENCOUNTER — Other Ambulatory Visit: Payer: Self-pay | Admitting: Neurology

## 2012-12-10 DIAGNOSIS — F0280 Dementia in other diseases classified elsewhere without behavioral disturbance: Secondary | ICD-10-CM

## 2012-12-10 DIAGNOSIS — F0391 Unspecified dementia with behavioral disturbance: Secondary | ICD-10-CM

## 2012-12-10 MED ORDER — QUETIAPINE FUMARATE 25 MG PO TABS: 25.0000 mg | ORAL_TABLET | Freq: Every day | ORAL | Status: DC

## 2012-12-10 NOTE — Telephone Encounter (Signed)
I spoke with the pt's daughter and Dr Frances Furbish re: her father's medication. Adjustments have been made and faxed to the pt's care facility.  See prior message.

## 2012-12-11 ENCOUNTER — Telehealth: Payer: Self-pay | Admitting: Neurology

## 2012-12-14 NOTE — Telephone Encounter (Signed)
Print out given to [facility].

## 2013-01-05 ENCOUNTER — Other Ambulatory Visit: Payer: Self-pay

## 2013-01-05 ENCOUNTER — Telehealth: Payer: Self-pay

## 2013-01-05 NOTE — Telephone Encounter (Signed)
Order re-faxed to Nursing Facility to D/C Ativan and Risperdal.  As well re-faxed Seroquel Order.  All info was verified with Dr Frances Furbish.  Fax Conf time stamped 5:27pm

## 2013-01-06 ENCOUNTER — Ambulatory Visit: Payer: Self-pay | Admitting: Neurology

## 2013-01-12 ENCOUNTER — Ambulatory Visit (INDEPENDENT_AMBULATORY_CARE_PROVIDER_SITE_OTHER): Payer: Medicare Other | Admitting: Neurology

## 2013-01-12 ENCOUNTER — Encounter: Payer: Self-pay | Admitting: Neurology

## 2013-01-12 VITALS — BP 114/67 | HR 52 | Temp 97.5°F | Ht 72.0 in | Wt 199.0 lb

## 2013-01-12 DIAGNOSIS — I4891 Unspecified atrial fibrillation: Secondary | ICD-10-CM

## 2013-01-12 DIAGNOSIS — R011 Cardiac murmur, unspecified: Secondary | ICD-10-CM

## 2013-01-12 DIAGNOSIS — G609 Hereditary and idiopathic neuropathy, unspecified: Secondary | ICD-10-CM

## 2013-01-12 DIAGNOSIS — F0391 Unspecified dementia with behavioral disturbance: Secondary | ICD-10-CM

## 2013-01-12 DIAGNOSIS — G309 Alzheimer's disease, unspecified: Secondary | ICD-10-CM

## 2013-01-12 NOTE — Patient Instructions (Addendum)
I think overall you are doing fairly well but I do want to suggest a few things today:  Remember to drink plenty of fluid, eat healthy meals and do not skip any meals. Try to eat protein with a every meal and eat a healthy snack such as fruit or nuts in between meals. Try to keep a regular sleep-wake schedule and try to exercise daily, particularly in the form of walking, 20-30 minutes a day, if you can.   Engage in social activities in your community and with your family and try to keep up with current events by reading the newspaper or watching the news.   As far as your medications are concerned, I would like to suggest no changes.   As far as diagnostic testing: no new test at this time.  I would like to see you back in 4 months, sooner if we need to. Please call us with any interim questions, concerns, problems, updates or refill requests.  Steve is my clinical assistant and will answer any of your questions and relay your messages to me and also relay most of my messages to you.  Our phone number is [phone]. We also have an after hours call service for urgent matters and there is a physician on-call for urgent questions. For any emergencies you know to call 911 or go to the nearest emergency room.     

## 2013-01-12 NOTE — Progress Notes (Signed)
Subjective:    Patient ID: Spencer Jones is a 79 y.o. male.  HPI  Interim history:  Spencer Jones is a very friendly 79 year old right-handed gentleman who presents for followup consultation of his Alzheimer's disease, associated with behavioral problems. I first met him and his daughter Spencer Jones on 10/06/2012 and he previously followed with [facility]. In the last 6 months he was started on Namenda. Spencer Jones recommended 24-7 care and supervision. The patient has a history of memory loss for almost 5 years. He has had agitation and outbursts. CT head in June 2011 showed mild generalized atrophy and chronic microvascular ischemia. He resides at [facility] in the memory care unit. He was started on Exelon patch but developed a rash. He is on Namenda XR 28 mg daily and Razadyne ER 8 mg daily. He was on when necessary Risperdal do to agitation and his wife has been in rehabilitation at the same residential facility. He has an underlying medical history of atrial fibrillation, coronary artery disease, status post bypass, hypertension, hyperlipidemia, prostate cancer, renal insufficiency, gout, pacemaker placement for sick sinus disease and Alzheimer's disease. At the time of his first visit with me his MMSE was 25. I kept him on the same medications and suggested a small dose of Seroquel as well as discontinuation of risperidone. His wife is in skilled nursing and he has been wandering off and has been found in another unit. He has been calling his wife on the phone multiple times a day. His daughter reports relatively good results with the seroquel, which is now at 25 mg at night and I clarified orders to stop risperdal and Ativan, as he was still getting ativan during the day for agitation.   His Past Medical History Is Significant For: Past Medical History  Diagnosis Date  . Atrial fibrillation     permanent  . Coronary atherosclerosis of artery bypass graft 1991  . Hypertension   . Hyperlipidemia    . Prostate cancer   . Renal insufficiency   . Gout   . Tachycardia-bradycardia     s/p PPM by [facility]  . Alzheimer disease   . Dementia with behavioral disturbance     His Past Surgical History Is Significant For: Past Surgical History  Procedure Laterality Date  . Coronary artery bypass graft  1991  . Colonoscopy    . Pacemaker insertion      SJM implanted by [facility], most recent generator 2007  . Tonsillectomy    . Inguinal hernia repair    . Sebaceous cyst excision   2005, 2008  . Spermatocelectomy      His Family History Is Significant For: No family history on file.  His Social History Is Significant For: History   Social History  . Marital Status: Married    Spouse Name: N/A    Number of Children: N/A  . Years of Education: N/A   Social History Main Topics  . Smoking status: Former Games developer  . Smokeless tobacco: None  . Alcohol Use: No  . Drug Use: No  . Sexually Active: None   Other Topics Concern  . 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 Amoxicillin     REACTION: unspecified  . Penicillins   . Sulfamethoxazole     REACTION: unspecified  . Tetanus Toxoid Adsorbed     REACTION: unspecified  :   His Current Medications Are:  Outpatient Encounter Prescriptions as of 01/12/2013  Medication Sig Dispense Refill  . amLODipine (NORVASC) 2.5 MG tablet TAKE 1 TABLET DAILY  90 tablet  2  . atorvastatin (LIPITOR) 10 MG tablet Take 1 tablet (10 mg total) by mouth daily.  90 tablet  3  . benazepril (LOTENSIN) 40 MG tablet TAKE ONE-HALF (1/2) TABLET DAILY  45 tablet  2  . doxazosin (CARDURA) 2 MG tablet Take 1 tablet (2 mg total) by mouth at bedtime.  90 tablet  3  . galantamine (RAZADYNE ER)  8 MG 24 hr capsule Take 1 tablet by mouth Daily.      . memantine (NAMENDA TITRATION PACK) tablet pack Take 5 mg by mouth See admin instructions. 5 mg/day for =1 week; 5 mg twice daily for =1 week; 15 mg/day given in 5 mg and 10 mg separated doses for =1 week; then 10 mg twice daily      . metoprolol (LOPRESSOR) 50 MG tablet Take 1 tablet (50 mg total) by mouth 2 (two) times daily.  180 tablet  3  . nitroGLYCERIN (NITROSTAT) 0.4 MG SL tablet Place 0.4 mg under the tongue every 5 (five) minutes as needed.        Marland Kitchen QUEtiapine (SEROQUEL) 25 MG tablet Take 1 tablet (25 mg total) by mouth at bedtime. Take 1 pill each evening at 6 or 7 PM.  90 tablet  3  . sertraline (ZOLOFT) 50 MG tablet Take 50 mg by mouth daily.      Marland Kitchen warfarin (COUMADIN) 2.5 MG tablet TAKE DAILY AS DIRECTED BY THE ANTICOAGULATION CLINIC  120 tablet  0  . [DISCONTINUED] sildenafil (VIAGRA) 100 MG tablet Take 100 mg by mouth as directed.         No facility-administered encounter medications on file as of 01/12/2013.    Review of Systems  Psychiatric/Behavioral: Positive for dysphoric mood. The patient is nervous/anxious.     Objective:  Neurologic Exam  Physical Exam Physical Examination:   Filed Vitals:   01/12/13 1449  BP: 114/67  Pulse: 52  Temp: 97.5 F (36.4 C)    General Examination: The patient is a very pleasant 79 y.o. male in no acute distress. He is calm and cooperative with the exam. He denies Auditory Hallucinations and Visual Hallucinations.   HEENT: Normocephalic, atraumatic, pupils are equal, round and reactive to light and accommodation. Extraocular tracking shows mild saccadic breakdown. Face is symmetric with normal facial animation and normal facial sensation. Speech is clear with no dysarthria noted. There is very mild hypophonia. There is no lip, neck or jaw tremor. Neck is supple with full range of motion. Oropharynx exam reveals normal findings except mild mouth dryness, tongue protrudes centrally  and palate elevates symmetrically.   Chest: is clear to auscultation without wheezing, rhonchi or crackles noted.  Heart: sounds are somewhat irregular and he does have a 3/6 systolic murmur.    Abdomen: is soft, non-tender and non-distended with normal bowel sounds appreciated on auscultation.  Extremities: There is no pitting edema in the distal lower extremities bilaterally. Pedal pulses are intact.  Skin: is warm and dry with no trophic changes noted.  Musculoskeletal: exam reveals no obvious joint deformities, tenderness or joint swelling or erythema.  Neurologically:  Mental status: The patient is awake, alert and oriented to place, circumstance, not month, but day of week and year and season. AFT is 9. There is no aphasia, agnosia, apraxia or anomia. Speech is clear with normal prosody and enunciation. Thought process is linear. Mood is congruent. Cranial nerves are as described above under HEENT exam. In addition, shoulder shrug is normal with equal shoulder height noted. Motor exam: Normal bulk, strength and tone is noted. There is no drift, tremor or rebound. Romberg is negative. Reflexes are 2+ throughout. Fine motor skills are slightly slow but with intact with normal finger taps, normal hand movements, normal rapid alternating patting, normal foot taps and normal foot agility.  Cerebellar testing shows no dysmetria or intention tremor on finger to nose testing. There is no truncal or gait ataxia.  Sensory exam is intact to light touch, pinprick, vibration, temperature sense in the upper extremities but he does have decreased vibration sense, temperature sense and pinprick in the distal lower extremities bilaterally.   Gait, station and balance are unremarkable for age. No veering to one side is noted. No leaning to one side. Posture is age-appropriate and stance is narrow based. No problems turning are noted. He turns en bloc.                Assessment and Plan:    In summary,  MACHAI DESMITH is a very pleasant 79 y.o.-year old male with a history of Alzheimer's disease with behavioral disturbance. He remains stable at this time. Per daughter he has had more agitation and wandering, but Seroquel has helped and he sleeps well. I again had a long chat with the patient and his daughter about my findings and the diagnosis of AD, its prognosis and treatment options. We talked about medical treatments and non-pharmacological approaches. We talked about maintaining a healthy lifestyle in general. I encouraged the patient to eat healthy, exercise daily and keep well hydrated, to keep a scheduled bedtime and wake time routine, to not skip any meals and eat healthy snacks in between meals and to have protein with every meal. I would like for him to drink more water.   I recommended the following at this time: continue galantamine and Namenda at the same dose and he is off of Ativan and Risperdal. Continue with Seroquel at night.  Follow up in 4 months, sooner if the need arises.

## 2013-01-13 ENCOUNTER — Encounter: Payer: Self-pay | Admitting: Internal Medicine

## 2013-01-13 ENCOUNTER — Telehealth: Payer: Self-pay | Admitting: Internal Medicine

## 2013-01-13 NOTE — Telephone Encounter (Signed)
01-13-13 lmm for dtr kathy crawford to set up past due pacer ck, also sent past due letter/mt

## 2013-02-12 ENCOUNTER — Telehealth: Payer: Self-pay | Admitting: Internal Medicine

## 2013-02-12 ENCOUNTER — Encounter: Payer: Self-pay | Admitting: Internal Medicine

## 2013-02-12 NOTE — Telephone Encounter (Signed)
02-12-13 sent past due letter, certified, no response to message to dtr 12-14-12 or letter to her 01-13-13/mt

## 2013-03-18 ENCOUNTER — Telehealth: Payer: Self-pay | Admitting: Internal Medicine

## 2013-03-18 NOTE — Telephone Encounter (Signed)
Noted in paceart/kwm  

## 2013-03-18 NOTE — Telephone Encounter (Signed)
03-18-13 certified letter signed and rtn, signed by dtr Olegario Messier crawford/mt

## 2013-04-09 ENCOUNTER — Encounter: Payer: Self-pay | Admitting: Cardiology

## 2013-04-09 ENCOUNTER — Ambulatory Visit (INDEPENDENT_AMBULATORY_CARE_PROVIDER_SITE_OTHER): Payer: Medicare Other | Admitting: Cardiology

## 2013-04-09 VITALS — BP 122/77 | HR 49 | Ht 72.0 in | Wt 207.4 lb

## 2013-04-09 DIAGNOSIS — I4891 Unspecified atrial fibrillation: Secondary | ICD-10-CM

## 2013-04-09 DIAGNOSIS — I2510 Atherosclerotic heart disease of native coronary artery without angina pectoris: Secondary | ICD-10-CM

## 2013-04-09 DIAGNOSIS — I498 Other specified cardiac arrhythmias: Secondary | ICD-10-CM

## 2013-04-09 DIAGNOSIS — R001 Bradycardia, unspecified: Secondary | ICD-10-CM

## 2013-04-09 DIAGNOSIS — Z950 Presence of cardiac pacemaker: Secondary | ICD-10-CM

## 2013-04-09 LAB — PACEMAKER DEVICE OBSERVATION
BATTERY VOLTAGE: 2.78 V
RV LEAD THRESHOLD: 1.375 V
RV LEAD THRESHOLD: 1.5 V

## 2013-04-09 NOTE — Progress Notes (Signed)
ELECTROPHYSIOLOGY OFFICE NOTE  Patient ID: Spencer Jones MRN: [id number], DOB/AGE: 1925/01/29   Date of Visit: 04/09/2013  Primary Physician: Darlina Guys, MD Primary Cardiologist: Shirlee Latch, MD / Johney Frame, MD Reason for Visit: EP/device follow-up  History of Present Illness  Spencer Jones is a 79 y.o. male with bradycardia s/p PPM implant, permanent AF, CAD, HTN and Alzheimer's dementia who presents today for routine electrophysiology followup. He is accompanied by his son and daughter. Since last being seen in our clinic, he reports he is doing well and has no complaints. He denies chest pain or shortness of breath. He denies palpitations, dizziness, near syncope or syncope. He denies LE swelling, orthopnea, PND or recent weight gain. He is compliant with his medications.  Past Medical History Past Medical History  Diagnosis Date  . Atrial fibrillation     permanent  . Coronary atherosclerosis of artery bypass graft 1991  . Hypertension   . Hyperlipidemia   . Prostate cancer   . Renal insufficiency   . Gout   . Tachycardia-bradycardia     s/p PPM by Dr Juanda Chance  . Alzheimer disease   . Dementia with behavioral disturbance     Past Surgical History Past Surgical History  Procedure Laterality Date  . Coronary artery bypass graft  1991  . Colonoscopy    . Pacemaker insertion      SJM implanted by Dr Juanda Chance, most recent generator 2007  . Tonsillectomy    . Inguinal hernia repair    . Sebaceous cyst excision   2005, 2008  . Spermatocelectomy      Allergies/Intolerances Allergies  Allergen Reactions  . Amoxicillin     REACTION: unspecified  . Penicillins   . Sulfamethoxazole     REACTION: unspecified  . Tetanus Toxoid Adsorbed     REACTION: unspecified    Current Home Medications Current Outpatient Prescriptions  Medication Sig Dispense Refill  . amLODipine (NORVASC) 2.5 MG tablet TAKE 1 TABLET DAILY  90 tablet  2  . atorvastatin (LIPITOR) 10 MG tablet Take 1  tablet (10 mg total) by mouth daily.  90 tablet  3  . benazepril (LOTENSIN) 40 MG tablet TAKE ONE-HALF (1/2) TABLET DAILY  45 tablet  2  . doxazosin (CARDURA) 2 MG tablet Take 1 tablet (2 mg total) by mouth at bedtime.  90 tablet  3  . galantamine (RAZADYNE ER) 8 MG 24 hr capsule Take 1 tablet by mouth Daily.      . memantine (NAMENDA TITRATION PACK) tablet pack Take 5 mg by mouth See admin instructions. 5 mg/day for =1 week; 5 mg twice daily for =1 week; 15 mg/day given in 5 mg and 10 mg separated doses for =1 week; then 10 mg twice daily      . metoprolol (LOPRESSOR) 50 MG tablet Take 1 tablet (50 mg total) by mouth 2 (two) times daily.  180 tablet  3  . nitroGLYCERIN (NITROSTAT) 0.4 MG SL tablet Place 0.4 mg under the tongue every 5 (five) minutes as needed.        Marland Kitchen QUEtiapine (SEROQUEL) 25 MG tablet Take 1 tablet (25 mg total) by mouth at bedtime. Take 1 pill each evening at 6 or 7 PM.  90 tablet  3  . sertraline (ZOLOFT) 50 MG tablet Take 50 mg by mouth daily.      Marland Kitchen warfarin (COUMADIN) 2.5 MG tablet TAKE DAILY AS DIRECTED BY THE ANTICOAGULATION CLINIC  120 tablet  0   No current facility-administered medications  for this visit.    Social History Social History  . Marital Status: Married   Social History Main Topics  . Smoking status: Former Games developer  . Smokeless tobacco: Not on file  . Alcohol Use: No  . 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'   2 sons-'60, '71; 4 daughters- '54, '56, '57, '65; 11 grandchildren   End-of-life: has a living will; lives at memory care facility    Review of Systems General: No chills, fever, night sweats or weight changes Cardiovascular: No chest pain, dyspnea on exertion, edema, orthopnea, palpitations, paroxysmal nocturnal dyspnea Dermatological: No rash, lesions or masses Respiratory: No cough, dyspnea Urologic: No hematuria, dysuria Abdominal:  No nausea, vomiting, diarrhea, bright red blood per rectum, melena, or hematemesis Neurologic: No visual changes, weakness, changes in mental status All other systems reviewed and are otherwise negative except as noted above.  Physical Exam Vitals: Blood pressure 122/77, pulse 49, height 6' (1.829 m), weight 207 lb 6.4 oz (94.076 kg).  General: Well developed, well appearing 79 y.o. male in no acute distress. HEENT: Normocephalic, atraumatic. EOMs intact. Sclera nonicteric. Oropharynx clear.  Neck: Supple. No JVD. Lungs: Respirations regular and unlabored, CTA bilaterally. No wheezes, rales or rhonchi. Heart: RRR. S1, S2 present. No murmurs, rub, S3 or S4. Abdomen: Soft, non-distended.  Extremities: No clubbing, cyanosis or edema. PT/Radials 2+ and equal bilaterally. Psych: Normal affect. Neuro: Alert. Moves all extremities spontaneously.   Diagnostics Device interrogation - Normal VVI PPM function. V paced 43% of time. Threshold, sensing, impedance consistent with previous measurements. Device programmed to maximize longevity. Episode triggers are off. Device programmed at appropriate safety margins. Histogram distribution appropriate for patient activity level. Device programmed to optimize intrinsic conduction. Estimated longevity 4.75 - 6.75 years.  Assessment and Plan 1. Bradycardia s/p PPM implant - normal device function - no programming changes made - return for follow-up with Dr. Johney Frame in one year 2. Permanent AF - rate controlled - continue BB for rate control and warfarin for stroke risk reduction 3. CAD - stable without anginal symptoms - continue medical therapy and routine follow-up with Dr. Shirlee Latch 4. Alzheimers dementia  Signed, Rick Duff, PA-C 04/09/2013, 12:19 PM

## 2013-04-21 ENCOUNTER — Encounter: Payer: Self-pay | Admitting: Internal Medicine

## 2013-05-13 ENCOUNTER — Ambulatory Visit (INDEPENDENT_AMBULATORY_CARE_PROVIDER_SITE_OTHER): Payer: Medicare Other | Admitting: Surgery

## 2013-05-13 ENCOUNTER — Encounter (INDEPENDENT_AMBULATORY_CARE_PROVIDER_SITE_OTHER): Payer: Self-pay | Admitting: Surgery

## 2013-05-13 ENCOUNTER — Encounter (INDEPENDENT_AMBULATORY_CARE_PROVIDER_SITE_OTHER): Payer: Self-pay

## 2013-05-13 VITALS — BP 140/90 | HR 64 | Temp 99.3°F | Resp 14 | Ht 73.0 in | Wt 208.6 lb

## 2013-05-13 DIAGNOSIS — L723 Sebaceous cyst: Secondary | ICD-10-CM

## 2013-05-13 NOTE — Progress Notes (Signed)
[facility]  Ovidio Kin, MD,  FACS [address]  [address] [place], [place]    [address] Phone:  [phone] FAX:  [phone]   Re:   [facility] DOB:   [birth year] MRN:   [id number]  ASSESSMENT AND PLAN: 1.  Sebaceous on left upper chest/left clavicle  It is not infected.  I gave the son a sheet on sebaceous cysts.  I discussed removing this, but it would require coming off coumadin for 7 days.  His son is going to talk to his doctor about if this needs to be done and what it requires.  2.  History of A fib 3.  CAD  Last note is from [employer], [employer]. 4.  HTN 5.  Alzheimer's disease 6.  Has pacemaker 7.  On coumadin - INR unknown.  HISTORY OF PRESENT ILLNESS: Chief Complaint  Patient presents with  . Follow-up    cyst on check drainage on shirt looked little yellowish    Spencer [facility] is a 79 y.o. (DOB: [birth year])  white  male who is a patient of POWELL, JERRY, MD and comes to me today for a cyst of his left upper chest. His son, Felicity Pellegrini, is with him. The area hurts when he hits it. He is on chronic anticoagulation.  The last labs in Epic were 05/26/2012.  He is now at [facility].  His wife is in SNF at [facility].  Past Medical History  Diagnosis Date  . Atrial fibrillation     permanent  . Coronary atherosclerosis of artery bypass graft 1991  . Hypertension   . Hyperlipidemia   . Prostate cancer   . Renal insufficiency   . Gout   . Tachycardia-bradycardia     s/p PPM by Dr Juanda Chance  . Alzheimer disease   . Dementia with behavioral disturbance     SOCIAL HISTORY: At [facility]. Wife in SNF. Son, [place], with patient.  PHYSICAL EXAM: BP 140/90  Pulse 64  Temp(Src) 99.3 F (37.4 C) (Temporal)  Resp 14  Ht 6\' 1"  (1.854 m)  Wt 208 lb 9.6 oz (94.62 kg)  BMI 27.53 kg/m2  Chest - 2.5 cm lesions at left medial clavicle.  Right subclavian pacemaker.  His son said that this is his 3rd model.  The cyst is not  infected.  Has right subclavian pacemaker.  DATA REVIEWED: Epic notes.  Ovidio Kin, MD,  [facility] [address] [place]  [address]   [place], [place]    [address] Phone:  [phone] FAX:  [phone]

## 2013-05-18 ENCOUNTER — Ambulatory Visit (INDEPENDENT_AMBULATORY_CARE_PROVIDER_SITE_OTHER): Payer: Medicare Other | Admitting: General Surgery

## 2013-05-19 ENCOUNTER — Ambulatory Visit (INDEPENDENT_AMBULATORY_CARE_PROVIDER_SITE_OTHER): Payer: Medicare Other | Admitting: Neurology

## 2013-05-19 ENCOUNTER — Encounter: Payer: Self-pay | Admitting: Neurology

## 2013-05-19 VITALS — BP 134/79 | HR 49 | Temp 97.6°F | Ht 72.0 in | Wt 209.0 lb

## 2013-05-19 DIAGNOSIS — F0280 Dementia in other diseases classified elsewhere without behavioral disturbance: Secondary | ICD-10-CM

## 2013-05-19 DIAGNOSIS — R011 Cardiac murmur, unspecified: Secondary | ICD-10-CM

## 2013-05-19 DIAGNOSIS — F0391 Unspecified dementia with behavioral disturbance: Secondary | ICD-10-CM

## 2013-05-19 DIAGNOSIS — Z950 Presence of cardiac pacemaker: Secondary | ICD-10-CM

## 2013-05-19 NOTE — Patient Instructions (Signed)
I think overall you are doing fairly well but I do want to suggest a few things today:  Remember to drink plenty of fluid, eat healthy meals and do not skip any meals. Try to eat protein with a every meal and eat a healthy snack such as fruit or nuts in between meals. Try to keep a regular sleep-wake schedule and try to exercise daily, particularly in the form of walking, 20-30 minutes a day, if you can.   Engage in social activities in your community and with your family and try to keep up with current events by reading the newspaper or watching the news.   As far as your medications are concerned, I would like to suggest no changes.   As far as diagnostic testing: no new test at this time.  I would like to see you back in 5 months, sooner if we need to. Please call us with any interim questions, concerns, problems, updates or refill requests.  Brett Canales is my clinical assistant and will answer any of your questions and relay your messages to me and also relay most of my messages to you.  Our phone number is [phone]. We also have an after hours call service for urgent matters and there is a physician on-call for urgent questions. For any emergencies you know to call 911 or go to the nearest emergency room.

## 2013-05-19 NOTE — Progress Notes (Signed)
Subjective:    Patient ID: Spencer Jones is a 79 y.o. male.  HPI  Interim history:   Spencer Jones is a very friendly 79 year old right-handed gentleman who presents for followup consultation of his Alzheimer's disease, associated with behavioral problems. He is accompanied by his daughter again today. I last saw him on 01/12/2013, at which time I suggested that he continue his current medications.  I first met him and his daughter Spencer Jones on 10/06/2012 and he previously followed with Spencer Jones. Dr. Sandria Manly recommended 24-7 care and supervision. The patient has a history of memory loss for 5 1/2 years. He has had agitation and outbursts. CT head in June 2011 showed mild generalized atrophy and chronic microvascular ischemia. He resides at [facility] in the memory care unit. He was started on Exelon patch but developed a rash and it was stopped.  He is on Namenda XR 28 mg daily and Razadyne ER 8 mg daily. He was on prn Risperdal and Ativan, d/t agitation and wandering. His wife has been in rehabilitation at the same residential facility. He has an underlying medical history of atrial fibrillation, coronary artery disease, status post bypass, hypertension, hyperlipidemia, prostate cancer, renal insufficiency, gout, pacemaker placement for sick sinus disease and Alzheimer's disease. At the time of his first visit with me his MMSE was 25. I kept him on the same medications and suggested a small dose of Seroquel as well as discontinuation of risperidone.  His wife is in skilled nursing and he had episodes of wandering off and was found in another unit. He has been calling his wife on the phone multiple times a day. His daughter reported relatively good results with the seroquel, which is now at 25 mg at night and I clarified orders to stop risperdal and Ativan.  Spencer Jones reports stable conditions. He has not had any recent medication changes, or illness. He has not had any recent episodes of behavioral disturbance  or wandering off. Unfortunately, his wife is not doing so well. He still struggles with the fact that they cannot live together. Spencer Jones reports that he has been writing a lot lately, such as in a journal. He does not like to do word puzzles. He rarely watches TV. He does not typically like to read the newspaper. He has always been the numbers person. He has no side effects from his medications including the Seroquel, Razadyne ER, and Namenda XR.  His Past Medical History Is Significant For: Past Medical History  Diagnosis Date  . Atrial fibrillation     permanent  . Coronary atherosclerosis of artery bypass graft 1991  . Hypertension   . Hyperlipidemia   . Prostate cancer   . Renal insufficiency   . Gout   . Tachycardia-bradycardia     s/p PPM by Dr Juanda Chance  . Alzheimer disease   . Dementia with behavioral disturbance     His Past Surgical History Is Significant For: Past Surgical History  Procedure Laterality Date  . Coronary artery bypass graft  1991  . Colonoscopy    . Pacemaker insertion      SJM implanted by Dr Juanda Chance, most recent generator 2007  . Tonsillectomy    . Inguinal hernia repair    . Sebaceous cyst excision   2005, 2008  . Spermatocelectomy      His Family History Is Significant For: History reviewed. No pertinent family history.  His Social History Is Significant For: History   Social History  . Marital Status: Married  Spouse Name: N/A    Number of Children: N/A  . Years of Education: N/A   Social History Main Topics  . Smoking status: Former Games developer  . Smokeless tobacco: None  . Alcohol Use: No  . Drug Use: No  . Sexual Activity: None   Other Topics Concern  . 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 Amoxicillin     REACTION: unspecified  . Penicillins   . Sulfamethoxazole     REACTION: unspecified  . Tetanus Toxoid Adsorbed     REACTION: unspecified  :   His Current Medications Are:  Outpatient Encounter Prescriptions as of 05/19/2013  Medication Sig  . amLODipine (NORVASC) 2.5 MG tablet TAKE 1 TABLET DAILY  . atorvastatin (LIPITOR) 10 MG tablet Take 1 tablet (10 mg total) by mouth daily.  . benazepril (LOTENSIN) 40 MG tablet TAKE ONE-HALF (1/2) TABLET DAILY  . doxazosin (CARDURA) 2 MG tablet Take 1 tablet (2 mg total) by mouth at bedtime.  Marland Kitchen galantamine (RAZADYNE ER) 8 MG 24 hr capsule Take 1 tablet by mouth Daily.  . memantine (NAMENDA TITRATION PACK) tablet pack Take 5 mg by mouth See admin instructions. 5 mg/day for =1 week; 5 mg twice daily for =1 week; 15 mg/day given in 5 mg and 10 mg separated doses for =1 week; then 10 mg twice daily  . metoprolol (LOPRESSOR) 50 MG tablet Take 1 tablet (50 mg total) by mouth 2 (two) times daily.  . nitroGLYCERIN (NITROSTAT) 0.4 MG SL tablet Place 0.4 mg under the tongue every 5 (five) minutes as needed.    Marland Kitchen QUEtiapine (SEROQUEL) 25 MG tablet Take 1 tablet (25 mg total) by mouth at bedtime. Take 1 pill each evening at 6 or 7 PM.  . sertraline (ZOLOFT) 50 MG tablet Take 50 mg by mouth daily.  Marland Kitchen warfarin (COUMADIN) 2.5 MG tablet TAKE DAILY AS DIRECTED BY THE ANTICOAGULATION CLINIC   Review of Systems:   Out of a complete 14 point review of systems, all are reviewed and negative with the exception of these symptoms as listed below:   Review of Systems  Constitutional: Negative.   HENT: Negative.   Eyes: Negative.   Respiratory: Negative.   Cardiovascular: Negative.   Gastrointestinal: Negative.   Endocrine: Negative.   Genitourinary: Negative.   Musculoskeletal: Negative.   Skin: Negative.   Allergic/Immunologic: Negative.   Neurological:       Memory loss  Hematological: Negative.    Psychiatric/Behavioral: Positive for confusion.    Objective:  Neurologic Exam  Physical Exam Physical Examination:   Filed Vitals:   05/19/13 1124  BP: 134/79  Pulse: 49  Temp: 97.6 F (36.4 C)   General Examination: The patient is a very pleasant 79 y.o. male in no acute distress. He is calm and cooperative with the exam. He denies Auditory Hallucinations and Visual Hallucinations.   HEENT: Normocephalic, atraumatic, pupils are equal, round and reactive to light and accommodation. Extraocular tracking shows mild saccadic breakdown. Face is symmetric with normal facial animation and normal facial sensation. Speech is clear with no dysarthria noted. There is very mild hypophonia. There is no lip, neck or  jaw tremor. Neck is supple with full range of motion. Oropharynx exam reveals normal findings except mild mouth dryness, tongue protrudes centrally and palate elevates symmetrically.   Chest: is clear to auscultation without wheezing, rhonchi or crackles noted.  Heart: sounds are somewhat irregular and he does have a 3/6 systolic murmur, unchanged.  Abdomen: is soft, non-tender and non-distended with normal bowel sounds appreciated on auscultation.  Extremities: There is no pitting edema in the distal lower extremities bilaterally. Pedal pulses are intact.  Skin: is warm and dry with no trophic changes noted.  Musculoskeletal: exam reveals no obvious joint deformities, tenderness or joint swelling or erythema.  Neurologically:  Mental status: The patient is awake, alert and oriented to place, circumstance, month, not day of date and week, but year and season. AFT is 6, CDT 3/4, MMSE is 22/30.  There is no aphasia, agnosia, apraxia or anomia. Speech is clear with normal prosody and enunciation. Thought process is linear. Mood is congruent. Cranial nerves are as described above under HEENT exam. In addition, shoulder shrug is normal with equal shoulder height noted. Motor exam:  Normal bulk, strength and tone is noted. There is no drift, tremor or rebound. Romberg is negative. Reflexes are 2+ in the UEs and trace in both knees and absent in both ankles. Fine motor skills are slightly slow but with intact with normal finger taps, normal hand movements, normal rapid alternating patting, normal foot taps and normal foot agility.  Cerebellar testing shows no dysmetria or intention tremor on finger to nose testing. There is no truncal or gait ataxia.  Sensory exam is intact to light touch, pinprick, vibration, temperature sense in the upper extremities but he does have decreased vibration sense, temperature sense and pinprick in the distal lower extremities bilaterally.   Gait, station and balance are unremarkable for age. No veering to one side is noted. No leaning to one side. Posture is age-appropriate and stance is narrow based. No problems turning are noted. He turns en bloc.          Assessment and Plan:   In summary, KOVEN BELINSKY is a very pleasant 79 year old male with a history of Alzheimer's disease with behavioral disturbance. He remains physically stable at this time, but has had a small decline in his MMSE and AFT. He had had some agitation and wandering in the recent past, but this is not the case recently. Seroquel has helped with his sleep and agitation in the evenings, and he sleeps well. His behavior is stable. I again had a long chat with the patient and his daughter about my findings and the diagnosis of AD, its prognosis and treatment options. We talked about medical treatments and non-pharmacological approaches. We talked about maintaining a healthy lifestyle in general. I encouraged the patient to eat healthy, exercise daily and keep well hydrated, to keep a scheduled bedtime and wake time routine, to not skip any meals and eat healthy snacks in between meals and to have protein with every meal. I would like for him to drink more water. He is advised to stay  active mentally and physically. He does not like to read the paper or walks the news on TV and he does not like to do word puzzles. He has always been in numbers person. She is advised to look into getting him samples and to go-type puzzles. She says she would go to the bookstore and see if she can get some math books for fifth graders or  so that he would like to do. Even though he has had a decline in his MMSE and his category fluency I would recommend no change in his medication. He is on the maximum dose of Namenda XR, and really he is on starting dose of Razadyne ER, however he has had problems with medication side effects and not the best track record as when it comes medication tolerance. For this reason, I would not really recommend an increase in the Razadyne but this will be the next consideration down the Road. As of right now I would like for him to continue the current medication regimen including the Seroquel which he has been tolerating well. I would be very cautious with increasing the Seroquel and his case. This is not only because of his advanced age but because of his heart history as well. I would like to see him back in about 5 months, sooner if the need arises and I encouraged them to call with any interim questions, concerns or updates. They were in agreement.

## 2013-05-20 ENCOUNTER — Ambulatory Visit (INDEPENDENT_AMBULATORY_CARE_PROVIDER_SITE_OTHER): Payer: Medicare Other | Admitting: General Surgery

## 2013-09-15 NOTE — Telephone Encounter (Signed)
Taken care of closing encounter

## 2013-10-01 ENCOUNTER — Telehealth: Payer: Self-pay | Admitting: Neurology

## 2013-10-01 ENCOUNTER — Encounter: Payer: Self-pay | Admitting: Neurology

## 2013-10-01 NOTE — Telephone Encounter (Signed)
Left message for patient about rescheduling 10/18/13 appointment to 02/22/14 per Dr. Guadelupe Sabin schedule. Printed and sent letter.

## 2013-10-18 ENCOUNTER — Ambulatory Visit: Payer: Medicare Other | Admitting: Neurology

## 2014-02-22 ENCOUNTER — Ambulatory Visit (INDEPENDENT_AMBULATORY_CARE_PROVIDER_SITE_OTHER): Payer: Medicare Other | Admitting: Neurology

## 2014-02-22 ENCOUNTER — Encounter: Payer: Self-pay | Admitting: Neurology

## 2014-02-22 VITALS — BP 154/82 | HR 82 | Temp 96.9°F | Ht 72.0 in | Wt 210.0 lb

## 2014-02-22 DIAGNOSIS — F0280 Dementia in other diseases classified elsewhere without behavioral disturbance: Secondary | ICD-10-CM

## 2014-02-22 DIAGNOSIS — F0391 Unspecified dementia with behavioral disturbance: Secondary | ICD-10-CM

## 2014-02-22 DIAGNOSIS — G309 Alzheimer's disease, unspecified: Principal | ICD-10-CM

## 2014-02-22 DIAGNOSIS — F329 Major depressive disorder, single episode, unspecified: Secondary | ICD-10-CM

## 2014-02-22 DIAGNOSIS — F03918 Unspecified dementia, unspecified severity, with other behavioral disturbance: Secondary | ICD-10-CM

## 2014-02-22 DIAGNOSIS — F32A Depression, unspecified: Secondary | ICD-10-CM

## 2014-02-22 DIAGNOSIS — F3289 Other specified depressive episodes: Secondary | ICD-10-CM

## 2014-02-22 MED ORDER — MEMANTINE HCL ER 28 MG PO CP24: 28.0000 mg | ORAL_CAPSULE | Freq: Every day | ORAL | Status: DC

## 2014-02-22 MED ORDER — QUETIAPINE FUMARATE 25 MG PO TABS: 25.0000 mg | ORAL_TABLET | Freq: Every day | ORAL | Status: DC

## 2014-02-22 MED ORDER — SERTRALINE HCL 50 MG PO TABS: 50.0000 mg | ORAL_TABLET | Freq: Every day | ORAL | Status: DC

## 2014-02-22 MED ORDER — GALANTAMINE HYDROBROMIDE ER 8 MG PO CP24: 8.0000 mg | ORAL_CAPSULE | Freq: Every day | ORAL | Status: DC

## 2014-02-22 NOTE — Progress Notes (Signed)
Subjective:    Patient ID: Spencer Jones is a 80 y.o. male.  HPI    Interim history:   Spencer Jones is a very friendly 80 year old right-handed gentleman with an underlying medical history of atrial fibrillation, coronary artery disease, status post bypass, hypertension, hyperlipidemia, prostate cancer, renal insufficiency, gout, pacemaker placement for sick sinus disease and Alzheimer's disease, who presents for followup consultation of his Alzheimer's disease, associated with behavioral problems. He is accompanied by his daughter again today. I last saw him on 05/19/2013, at which time Spencer Jones reported stable conditions. He did not have any recent episodes of behavioral disturbance or wandering off. Unfortunately, his wife was not doing so well. He was struggling with the fact that they cannot live together. Spencer Jones reported that he was writing a lot, such as in a journal. He does not like to do word puzzles. He rarely watches TV. He does not typically like to read the newspaper. He has always been a numbers person. He has had no side effects from his medications including the Seroquel, Razadyne ER, and Namenda XR and I kept his medications the same.  Today, she says, that he is doing well. His wife is in SNF. He still likes to write. He has had no recent behavioral issues. He did have a fall in the bathroom about 2 months ago. He did not have a head injury, no LOC, no HA.   I saw him on 01/12/2013, at which time I suggested that he continue his current medications.  I first met him and his daughter Spencer Jones on 10/06/2012 and he previously followed with Dr. Jeneen Rinks love. Dr. Erling Cruz recommended 24-7 care and supervision. The patient has a history of memory loss for 5 1/2 years. He has had agitation and outbursts. CT head in June 2011 showed mild generalized atrophy and chronic microvascular ischemia. He resides at [facility] in the memory care unit. He was started on Exelon patch but developed a rash and it was  stopped.  He is on Namenda XR 28 mg daily and Razadyne ER 8 mg daily. He was on prn Risperdal and Ativan, d/t agitation and wandering. His wife has been in rehabilitation at the same residential facility. At the time of his first visit with me his MMSE was 25. I kept him on the same medications and suggested a small dose of Seroquel as well as discontinuation of risperidone.  His wife is in skilled nursing and he had episodes of wandering off and was found in another unit. He has been calling his wife on the phone multiple times a day. His daughter reported relatively good results with the seroquel, which is now at 25 mg at night and I clarified orders to stop risperdal and Ativan.     His Past Medical History Is Significant For: Past Medical History  Diagnosis Date  . Atrial fibrillation     permanent  . Coronary atherosclerosis of artery bypass graft 1991  . Hypertension   . Hyperlipidemia   . Prostate cancer   . Renal insufficiency   . Gout   . Tachycardia-bradycardia     s/p PPM by Dr Olevia Perches  . Alzheimer disease   . Dementia with behavioral disturbance     His Past Surgical History Is Significant For: Past Surgical History  Procedure Laterality Date  . Coronary artery bypass graft  1991  . Colonoscopy    . Pacemaker insertion      SJM implanted by Dr Olevia Perches, most recent generator 2007  .  Tonsillectomy    . Inguinal hernia repair    . Sebaceous cyst excision   2005, 2008  . Spermatocelectomy      His Family History Is Significant For: No family history on file.  His Social History Is Significant For: History   Social History  . Marital Status: Married    Spouse Name: N/A    Number of Children: N/A  . Years of Education: N/A   Social History Main Topics  . Smoking status: Former Research scientist (life sciences)  . Smokeless tobacco: Never Used  . Alcohol Use: No  . Drug Use: No  . Sexual Activity: None   Other Topics Concern  . 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 Amoxicillin     REACTION: unspecified  . Penicillins   . Sulfamethoxazole     REACTION: unspecified  . Tetanus Toxoid Adsorbed     REACTION: unspecified  :   His Current Medications Are:  Outpatient Encounter Prescriptions as of 02/22/2014  Medication Sig  . amLODipine (NORVASC) 2.5 MG tablet TAKE 1 TABLET DAILY  . benazepril (LOTENSIN) 40 MG tablet TAKE ONE-HALF (1/2) TABLET DAILY  . galantamine (RAZADYNE ER) 8 MG 24 hr capsule Take 1 tablet by mouth Daily.  . memantine (NAMENDA TITRATION PACK) tablet pack Take 5 mg by mouth See admin instructions. 5 mg/day for =1 week; 5 mg twice daily for =1 week; 15 mg/day given in 5 mg and 10 mg separated doses for =1 week; then 10 mg twice daily  . metoprolol (LOPRESSOR) 50 MG tablet Take 1 tablet (50 mg total) by mouth 2 (two) times daily.  . nitroGLYCERIN (NITROSTAT) 0.4 MG SL tablet Place 0.4 mg under the tongue every 5 (five) minutes as needed.    Marland Kitchen QUEtiapine (SEROQUEL) 25 MG tablet Take 1 tablet (25 mg total) by mouth at bedtime. Take 1 pill each evening at 6 or 7 PM.  . sertraline (ZOLOFT) 50 MG tablet Take 50 mg by mouth daily.  Marland Kitchen warfarin (COUMADIN) 2.5 MG tablet TAKE DAILY AS DIRECTED BY THE ANTICOAGULATION CLINIC  . atorvastatin (LIPITOR) 10 MG tablet Take 1 tablet (10 mg total) by mouth daily.  Marland Kitchen doxazosin (CARDURA) 2 MG tablet Take 1 tablet (2 mg total) by mouth at bedtime.  :  Review of Systems:  Out of a complete 14 point review of systems, all are reviewed and negative with the exception of these symptoms as listed below:   Review of Systems  All other systems reviewed and are negative.   Objective:  Neurologic Exam  Physical Exam Physical Examination:   Filed  Vitals:   02/22/14 1522  BP: 154/82  Jones: 82  Temp: 96.9 F (36.1 C)   General Examination: The patient is a very pleasant 80 y.o. male in no acute distress. He is calm and cooperative with the exam. He denies Auditory Hallucinations and Visual Hallucinations.   HEENT: Normocephalic, atraumatic, pupils are equal, round and reactive to light and accommodation. Extraocular tracking shows mild saccadic breakdown. Face is symmetric with normal facial animation and normal facial sensation. Speech is clear with no dysarthria noted. There is very mild hypophonia. There is no  lip, neck or jaw tremor. Neck is supple with full range of motion. Oropharynx exam reveals normal findings except mild mouth dryness, tongue protrudes centrally and palate elevates symmetrically.   Chest: is clear to auscultation without wheezing, rhonchi or crackles noted.  Heart: sounds are somewhat irregular and he does have a 3/6 systolic murmur, unchanged.  Abdomen: is soft, non-tender and non-distended with normal bowel sounds appreciated on auscultation.  Extremities: There is no pitting edema in the distal lower extremities bilaterally. Pedal pulses are intact.  Skin: is warm and dry with no trophic changes noted.  Musculoskeletal: exam reveals no obvious joint deformities, tenderness or joint swelling or erythema.  Neurologically:  Mental status: The patient is awake, alert and oriented to place, circumstance, month, not day of date and week, but year and season. AFT is 6, CDT 3/4, MMSE is 22/30.   02/22/2014: MMSE is 23/30, AFT is 10, clock drawing is 2/4.  There is no aphasia, agnosia, apraxia or anomia. Speech is clear with normal prosody and enunciation. Thought process is linear. Mood is congruent. Cranial nerves are as described above under HEENT exam. In addition, shoulder shrug is normal with equal shoulder height noted. Motor exam: Normal bulk, strength and tone is noted. There is no drift, tremor or  rebound. Romberg is negative. Reflexes are 2+ in the UEs and trace in both knees and absent in both ankles. Fine motor skills are slightly slow but with intact with normal finger taps, normal hand movements, normal rapid alternating patting, normal foot taps and normal foot agility.  Cerebellar testing shows no dysmetria or intention tremor on finger to nose testing. There is no truncal or gait ataxia.  Sensory exam is intact to light touch in the upper extremities but he does have decreased vibration sense, temperature sense and pinprick in the distal lower extremities bilaterally.   Gait, station and balance are unremarkable for age. No veering to one side is noted. No leaning to one side. Posture is age-appropriate and stance is mildly wide-based and he walks slightly insecurely but does not need any assistance. He turns in 3 steps.           Assessment and Plan:   In summary, Spencer Jones is a very pleasant 80 year old male with a history of Alzheimer's disease with behavioral disturbance in the past. He remains physically stable at this time, and has had stable findings in his memory scores. He had had some agitation and wandering in the past, but this is not the case recently. Seroquel has helped with his sleep and agitation in the evenings, and he sleeps well. His behavior is stable. I again had a long chat with the patient and his daughter about my findings and the diagnosis of AD, its prognosis and treatment options. We talked about medical treatments and non-pharmacological approaches. We talked about maintaining a healthy lifestyle in general. I encouraged the patient to eat healthy, exercise daily and keep well hydrated, to keep a scheduled bedtime and wake time routine, to not skip any meals and eat healthy snacks in between meals and to have protein with every meal. I would like for him to drink more water we again talked about potential side effects with antipsychotics in the elderly  population. Studies have shown however that Seroquel in low dose can be used to help with agitation in the elderly with memory problems. I think we have him on a low enough dose and his daughter feels that it really has  helped him. He is advised to stay active mentally and physically. He does not like to read the paper or walks the news on TV and he does not like to do word puzzles. He has always been in numbers person. He is on the maximum dose of Namenda XR, and really he is on the starting dose of Razadyne ER, however he has had problems with medication side effects in the past. I would like to keep his medications the same and his daughter was in agreement. An increase in the Razadyne would be the next consideration down the Road. As of right now I would like for him to continue the current medication regimen including the Zoloft 50 mg, Seroquel which he has been tolerating well. I would be very cautious with increasing the Seroquel and his case. I would like to see him back in about 6 months, sooner if the need arises and I encouraged them to call with any interim questions, concerns or updates. They were in agreement.

## 2014-02-22 NOTE — Patient Instructions (Signed)
We will continue your medications at the same dose. I will see you back in 6 month.

## 2014-04-29 NOTE — Telephone Encounter (Signed)
Nothing was typed/tmj 

## 2014-07-29 ENCOUNTER — Telehealth: Payer: Self-pay | Admitting: *Deleted

## 2014-07-29 NOTE — Telephone Encounter (Signed)
Called patient daughter took information she understood and is the one responsible for bringing him to his doctor appointments

## 2014-08-26 ENCOUNTER — Encounter: Payer: Self-pay | Admitting: Neurology

## 2014-08-26 ENCOUNTER — Ambulatory Visit (INDEPENDENT_AMBULATORY_CARE_PROVIDER_SITE_OTHER): Payer: Medicare Other | Admitting: Neurology

## 2014-08-26 VITALS — BP 140/82 | HR 50 | Temp 98.0°F | Ht 72.0 in | Wt 200.0 lb

## 2014-08-26 DIAGNOSIS — I482 Chronic atrial fibrillation, unspecified: Secondary | ICD-10-CM

## 2014-08-26 DIAGNOSIS — F0280 Dementia in other diseases classified elsewhere without behavioral disturbance: Secondary | ICD-10-CM

## 2014-08-26 DIAGNOSIS — Z950 Presence of cardiac pacemaker: Secondary | ICD-10-CM

## 2014-08-26 DIAGNOSIS — G309 Alzheimer's disease, unspecified: Secondary | ICD-10-CM | POA: Diagnosis not present

## 2014-08-26 NOTE — Progress Notes (Signed)
Subjective:    Patient ID: Spencer Jones is a 81 y.o. male.  HPI     Interim history:   Spencer Jones is a very friendly 81 year old right-handed gentleman with an underlying medical history of atrial fibrillation, coronary artery disease, status post bypass, hypertension, hyperlipidemia, prostate cancer, renal insufficiency, gout, pacemaker placement for sick sinus disease and dementia, who presents for followup consultation of his Alzheimer's disease, associated with behavioral problems. He is accompanied by his oldest daughter again and also his other daughter who is visiting from [place]. I last saw him on 02/22/2014, at which time his daughter reported that he was doing well. His wife was in skilled nursing. He was writing a journal. He had no recent behavioral escalations. He fell in the bathroom about 2 months prior to his visit but thankfully did not have any head injury, no headaches, no loss of consciousness. I kept him on the same medications, namely long-acting Razadyne, 8 mg, and Namenda XR 28 mg strength as well as low dose Seroquel and he has been on Zoloft 50 mg generic.  Today,  his older daughter reports that he has been stable for the most part. Recently had a upper respiratory infection and just this week finished his Z-Pak. He is not quite back to his normal self she feels. He is a little sluggish and at times slightly confused. His daughter in [place] usually calls them twice a day. She feels that he is doing well overall. They feel his memory actually has been stable. He had no recent medication changes. He had no recent illness otherwise. He feels well. He has no complaints. Appetite is good, weight is stable. He may not drink enough water and we have talked about this before.   I saw him on 05/19/2013, at which time Spencer Jones reported stable conditions. He did not have any recent episodes of behavioral disturbance or wandering off. Unfortunately, his wife was not doing so well. He  was struggling with the fact that they cannot live together. Spencer Jones reported that he was writing a lot, such as in a journal. He does not like to do word puzzles. He rarely watches TV. He does not typically like to read the newspaper. He has always been a numbers person. He has had no side effects from his medications including the Seroquel, Razadyne ER, and Namenda XR and I kept his medications the same.  I saw him on 01/12/2013, at which time I suggested that he continue his current medications.   I first met him and his daughter Spencer Jones on 10/06/2012 and he previously followed with Spencer Jones. Dr. Erling Cruz recommended 24-7 care and supervision. The patient has a history of memory loss for 5 1/2 years. He has had agitation and outbursts. CT head in June 2011 showed mild generalized atrophy and chronic microvascular ischemia. He resides at [facility] in the memory care unit. He was started on Exelon patch but developed a rash and it was stopped.   He is on Namenda XR 28 mg daily and Razadyne ER 8 mg daily. He was on prn Risperdal and Ativan, d/t agitation and wandering. His wife has been in rehabilitation at the same residential facility. At the time of his first visit with me his MMSE was 25. I kept him on the same medications and suggested a small dose of Seroquel as well as discontinuation of risperidone.   His wife is in skilled nursing and he had episodes of wandering off and was found in  another unit. He has been calling his wife on the phone multiple times a day. His daughter reported relatively good results with the seroquel, which is now at 25 mg at night and I clarified orders to stop risperdal and Ativan.      His Past Medical History Is Significant For: Past Medical History  Diagnosis Date  . Atrial fibrillation     permanent  . Coronary atherosclerosis of artery bypass graft 1991  . Hypertension   . Hyperlipidemia   . Prostate cancer   . Renal insufficiency   . Gout   .  Tachycardia-bradycardia     s/p PPM by Dr Olevia Perches  . Alzheimer disease   . Dementia with behavioral disturbance     His Past Surgical History Is Significant For: Past Surgical History  Procedure Laterality Date  . Coronary artery bypass graft  1991  . Colonoscopy    . Pacemaker insertion      SJM implanted by Dr Olevia Perches, most recent generator 2007  . Tonsillectomy    . Inguinal hernia repair    . Sebaceous cyst excision   2005, 2008  . Spermatocelectomy      His Family History Is Significant For: No family history on file.  His Social History Is Significant For: History   Social History  . Marital Status: Married    Spouse Name: N/A  . Number of Children: N/A  . Years of Education: N/A   Social History Main Topics  . Smoking status: Former Research scientist (life sciences)  . Smokeless tobacco: Never Used  . Alcohol Use: No  . Drug Use: No  . Sexual Activity: Not on file   Other Topics Concern  . 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 Amoxicillin     REACTION: unspecified  . Penicillins   . Sulfamethoxazole     REACTION: unspecified  . Tetanus Toxoid Adsorbed     REACTION: unspecified  :   His Current Medications Are:  Outpatient Encounter Prescriptions as of 08/26/2014  Medication Sig  . allopurinol (ZYLOPRIM) 300 MG tablet   . amLODipine (NORVASC) 2.5 MG tablet TAKE 1 TABLET DAILY  . benazepril (LOTENSIN) 20 MG tablet Take by mouth daily.   Marland Kitchen galantamine (RAZADYNE ER) 8 MG 24 hr capsule Take 1 capsule (8 mg total) by mouth daily.  . memantine (NAMENDA TITRATION PACK) tablet pack Take 5 mg by mouth See admin instructions. 5 mg/day for =1 week; 5 mg twice daily for =1 week; 15 mg/day given in 5 mg and 10 mg separated  doses for =1 week; then 10 mg twice daily  . Memantine HCl ER (NAMENDA XR) 28 MG CP24 Take 28 mg by mouth daily.  . metoprolol (LOPRESSOR) 50 MG tablet Take 1 tablet (50 mg total) by mouth 2 (two) times daily.  . nitroGLYCERIN (NITROSTAT) 0.4 MG SL tablet Place 0.4 mg under the tongue every 5 (five) minutes as needed.    Marland Kitchen QUEtiapine (SEROQUEL) 25 MG tablet Take 1 tablet (25 mg total) by mouth at bedtime. Take 1 pill each evening at 6 or 7 PM.  . sertraline (ZOLOFT) 50 MG tablet Take 1 tablet (50 mg total)  by mouth daily.  Marland Kitchen warfarin (COUMADIN) 3 MG tablet   . atorvastatin (LIPITOR) 10 MG tablet Take 1 tablet (10 mg total) by mouth daily.  Marland Kitchen doxazosin (CARDURA) 2 MG tablet Take 1 tablet (2 mg total) by mouth at bedtime.  . [DISCONTINUED] benazepril (LOTENSIN) 40 MG tablet TAKE ONE-HALF (1/2) TABLET DAILY  . [DISCONTINUED] warfarin (COUMADIN) 2.5 MG tablet TAKE DAILY AS DIRECTED BY THE ANTICOAGULATION CLINIC  :  Review of Systems:  Out of a complete 14 point review of systems, all are reviewed and negative with the exception of these symptoms as listed below:   Review of Systems  All other systems reviewed and are negative.   Objective:  Neurologic Exam  Physical Exam Physical Examination:   Filed Vitals:   08/26/14 1020  BP: 140/82  Jones: 50  Temp: 98 F (36.7 C)    General Examination: The patient is a very pleasant 81 y.o. male in no acute distress. He is calm and cooperative with the exam. He denies Auditory Hallucinations and Visual Hallucinations.   HEENT: Normocephalic, atraumatic, pupils are equal, round and reactive to light and accommodation. Extraocular tracking shows mild saccadic breakdown. Face is symmetric with normal facial animation and normal facial sensation. Speech is clear with no dysarthria noted. There is very mild hypophonia. There is no lip, neck or jaw tremor. Neck is supple with full range of motion. Oropharynx exam reveals normal findings except mild  mouth dryness, tongue protrudes centrally and palate elevates symmetrically.   Chest: is clear to auscultation without wheezing, rhonchi or crackles noted.  Heart: sounds are very slightly irregular and he does have a 3/6 systolic murmur, unchanged.  Abdomen: is soft, non-tender and non-distended with normal bowel sounds appreciated on auscultation.  Extremities: There is no pitting edema in the distal lower extremities bilaterally. Pedal pulses are intact.  Skin: is warm and dry with no trophic changes noted.  Musculoskeletal: exam reveals no obvious joint deformities, tenderness or joint swelling or erythema.  Neurologically:  Mental status: The patient is awake, alert and oriented to place, circumstance, month, not day or date and week, but year and season.   In 20014: AFT is 6, CDT 3/4, MMSE is 22/30.   On 02/22/2014: MMSE is 23/30, AFT is 10, clock drawing is 2/4.  On 08/26/2014: MMSE 19/30, CDT: 2/4, AFT: 9/min.  There is no aphasia, agnosia, apraxia or anomia. Speech is clear with normal prosody and enunciation. Thought process is linear. Mood is congruent. Cranial nerves are as described above under HEENT exam. In addition, shoulder shrug is normal with equal shoulder height noted. Motor exam: Normal bulk, strength and tone is noted. There is no drift, tremor or rebound. Romberg is negative. Reflexes are 1+ in the UEs and trace in both knees and absent in both ankles. Fine motor skills are slightly slow but with intact with normal finger taps, normal hand movements, normal rapid alternating patting, normal foot taps and normal foot agility.  Cerebellar testing shows no dysmetria or intention tremor on finger to nose testing. There is no truncal or gait ataxia.  Sensory exam is intact to light touch in the upper extremities but he does have decreased vibration sense in the distal lower extremities bilaterally.   Gait, station and balance are unremarkable for age. No veering to one  side is noted. No leaning to one side. Posture is age-appropriate and stance is mildly wide-based and he walks slightly insecurely but does not need any assistance. He turns in  3 steps.           Assessment and Plan:   In summary, DARRIK RICHMAN is a very pleasant 81 year old male with a history of Alzheimer's disease with behavioral disturbance in the past. He remains physically stable at this time, and has had some decline in his memory scores on today's examination but we have to keep in mind that he recently had a upper respiratory infection and just this week finished his Z-Pak. He may still be recovering from this. We mutually agreed to keep him on his long-acting Namenda 28 mg strength and Razadyne ER 8 mg once daily and do a reevaluation of his memory and about 4 months from now to reassess the situation and consider an increase in Razadyne ER at the time. He is encouraged to drink more water. He had had some agitation and wandering in the past, but not recently. Seroquel has helped with his sleep and agitation in the evenings, and he sleeps better, his behavior is stable. I again had a long chat with the patient and his daughters about my findings and the diagnosis of AD, its prognosis and treatment options. We talked about medical treatments and non-pharmacological approaches. We talked about maintaining a healthy lifestyle in general. I encouraged the patient to eat healthy, exercise daily and keep well hydrated, to keep a scheduled bedtime and wake time routine. He is advised to stay active mentally and physically. He does not like to read the paper or watches the news on TV and he does not like to do word puzzles. He has always been a numbers person. He is on the maximum dose of Namenda XR, and has been on Razadyne ER 8 mg. He has had problems with medication side effects in the past. As of right now I would like for him to continue the current medication regimen including the Zoloft 50 mg,  Seroquel which he has been tolerating well. I would be very cautious with increasing the Seroquel if need be. I would like to see him back in about 4 months, sooner if the need arises and I encouraged them to call with any interim questions, concerns or updates. They were in agreement.

## 2014-12-29 ENCOUNTER — Encounter: Payer: Self-pay | Admitting: Neurology

## 2014-12-29 ENCOUNTER — Ambulatory Visit (INDEPENDENT_AMBULATORY_CARE_PROVIDER_SITE_OTHER): Payer: Medicare Other | Admitting: Neurology

## 2014-12-29 VITALS — BP 143/82 | HR 49 | Wt 215.0 lb

## 2014-12-29 DIAGNOSIS — I482 Chronic atrial fibrillation, unspecified: Secondary | ICD-10-CM

## 2014-12-29 DIAGNOSIS — G309 Alzheimer's disease, unspecified: Secondary | ICD-10-CM | POA: Diagnosis not present

## 2014-12-29 DIAGNOSIS — F0391 Unspecified dementia with behavioral disturbance: Secondary | ICD-10-CM | POA: Diagnosis not present

## 2014-12-29 DIAGNOSIS — F03918 Unspecified dementia, unspecified severity, with other behavioral disturbance: Secondary | ICD-10-CM

## 2014-12-29 DIAGNOSIS — R011 Cardiac murmur, unspecified: Secondary | ICD-10-CM

## 2014-12-29 DIAGNOSIS — R635 Abnormal weight gain: Secondary | ICD-10-CM

## 2014-12-29 DIAGNOSIS — R600 Localized edema: Secondary | ICD-10-CM

## 2014-12-29 DIAGNOSIS — Z950 Presence of cardiac pacemaker: Secondary | ICD-10-CM | POA: Diagnosis not present

## 2014-12-29 DIAGNOSIS — F0280 Dementia in other diseases classified elsewhere without behavioral disturbance: Secondary | ICD-10-CM

## 2014-12-29 MED ORDER — QUETIAPINE FUMARATE 25 MG PO TABS: 25.0000 mg | ORAL_TABLET | Freq: Every day | ORAL | Status: DC

## 2014-12-29 MED ORDER — MEMANTINE HCL ER 28 MG PO CP24: 28.0000 mg | ORAL_CAPSULE | Freq: Every day | ORAL | Status: DC

## 2014-12-29 MED ORDER — GALANTAMINE HYDROBROMIDE ER 8 MG PO CP24: 8.0000 mg | ORAL_CAPSULE | Freq: Every day | ORAL | Status: DC

## 2014-12-29 NOTE — Patient Instructions (Signed)
Neurologically, you are stable.   You have gained weight and have new leg swelling which should be checked out by your primary care and/or cardiologist.

## 2014-12-29 NOTE — Progress Notes (Signed)
Subjective:    Patient ID: Spencer Jones is a 81 y.o. male.  HPI     Interim history:   Mr. Spencer Jones is a very friendly 81 year old right-handed gentleman with an underlying medical history of atrial fibrillation, coronary artery disease, status post bypass, hypertension, hyperlipidemia, prostate cancer, renal insufficiency, gout, pacemaker placement for sick sinus disease and dementia, who presents for followup consultation of his Alzheimer's disease, associated with behavioral problems (in the past). He is accompanied by his oldest daughter and his son today. I last saw him on 08/26/2014, at which time his daughter reported that he had been stable for the most part. He had a recent upper respiratory infection and had just finished his Z-Pak. Both his daughters at the time felt that he was not quite back to baseline. He was more sluggish and slightly confused. We mutually agreed to monitor his memory and keep him on Namenda long-acting and Razadyne ER at the current doses.   Today, 12/29/2014: He reports doing well. He has no complaints. He denies shortness of breath but his son states that lately when he took him for a haircut he seemed unusually out of breath and had low stamina. Overall memory wise he seems fairly stable but he is more withdrawn and less active overall. Appetite seems the same but they do not that he has gained weight since his last visit here of approximately 15 pounds. He has new leg swelling as well which we noted today. He is currently on galantamine ER 8 mg daily, Namenda long-acting 28 mg daily, and metoprolol 25 mg twice daily, Seroquel 25 mg each night, benazepril 20 mg daily, doxazosin 2 mg each night, Norvasc 5 mg daily, vitamin D 50,000 units once a week, allopurinol 300 mg daily, colchicine 0.3 mg daily, probiotic daily, Zoloft 25 mg every other day, alternating with 50 mg every other day, senna 2 tablets each bedtime, Coumadin.   Previously:   I saw him on  02/22/2014, at which time his daughter reported that he was doing well. His wife was in skilled nursing. He was writing a journal. He had no recent behavioral escalations. He fell in the bathroom about 2 months prior to his visit but thankfully did not have any head injury, no headaches, no loss of consciousness. I kept him on the same medications, namely long-acting Razadyne, 8 mg, and Namenda XR 28 mg strength as well as low dose Seroquel and he has been on Zoloft 50 mg generic.   I saw him on 05/19/2013, at which time [name] reported stable conditions. He did not have any recent episodes of behavioral disturbance or wandering off. Unfortunately, his wife was not doing so well. He was struggling with the fact that they cannot live together. [name] reported that he was writing a lot, such as in a journal. He does not like to do word puzzles. He rarely watches TV. He does not typically like to read the newspaper. He has always been a numbers person. He has had no side effects from his medications including the Seroquel, Razadyne ER, and Namenda XR and I kept his medications the same.  I saw him on 01/12/2013, at which time I suggested that he continue his current medications.   I first met him and his daughter [name] on 10/06/2012 and he previously followed with Dr. Jeneen Rinks love. Dr. Erling Cruz recommended 24-7 care and supervision. The patient has a history of memory loss for 5 1/2 years. He has had agitation and outbursts. CT  head in June 2011 showed mild generalized atrophy and chronic microvascular ischemia. He resides at [facility] in the memory care unit. He was started on Exelon patch but developed a rash and it was stopped.   He is on Namenda XR 28 mg daily and Razadyne ER 8 mg daily. He was on prn Risperdal and Ativan, d/t agitation and wandering. His wife has been in rehabilitation at the same residential facility. At the time of his first visit with me his MMSE was 25. I kept him on the same medications and  suggested a small dose of Seroquel as well as discontinuation of risperidone.   His wife is in skilled nursing and he had episodes of wandering off and was found in another unit. He has been calling his wife on the phone multiple times a day. His daughter reported relatively good results with the seroquel, which is now at 25 mg at night and I clarified orders to stop risperdal and Ativan.      His Past Medical History Is Significant For: Past Medical History  Diagnosis Date  . Atrial fibrillation     permanent  . Coronary atherosclerosis of artery bypass graft 1991  . Hypertension   . Hyperlipidemia   . Prostate cancer   . Renal insufficiency   . Gout   . Tachycardia-bradycardia     s/p PPM by Dr Olevia Perches  . Alzheimer disease   . Dementia with behavioral disturbance     His Past Surgical History Is Significant For: Past Surgical History  Procedure Laterality Date  . Coronary artery bypass graft  1991  . Colonoscopy    . Pacemaker insertion      SJM implanted by Dr Olevia Perches, most recent generator 2007  . Tonsillectomy    . Inguinal hernia repair    . Sebaceous cyst excision   2005, 2008  . Spermatocelectomy      His Family History Is Significant For: History reviewed. No pertinent family history.  His Social History Is Significant For: History   Social History  . Marital Status: Married    Spouse Name: N/A  . Number of Children: N/A  . Years of Education: N/A   Social History Main Topics  . Smoking status: Former Research scientist (life sciences)  . Smokeless tobacco: Never Used  . Alcohol Use: No  . Drug Use: No  . Sexual Activity: Not on file   Other Topics Concern  . 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 Amoxicillin      REACTION: unspecified  . Penicillins   . Sulfamethoxazole     REACTION: unspecified  . Tetanus Toxoid Adsorbed     REACTION: unspecified  :   His Current Medications Are:  Outpatient Encounter Prescriptions as of 12/29/2014  Medication Sig  . allopurinol (ZYLOPRIM) 300 MG tablet Take 300 mg by mouth daily.   Marland Kitchen amLODipine (NORVASC) 5 MG tablet Take 5 mg by mouth daily.  Marland Kitchen atorvastatin (LIPITOR) 10 MG tablet Take 10 mg by mouth daily.  . benazepril (LOTENSIN) 20 MG tablet Take by mouth daily.   . Cholecalciferol (VITAMIN D-3 PO) Take by mouth once a week. 50,000 unit tablet ,  one capsule weekly on Fridays  . COLCHICINE PO Take 0.3 mg by mouth.  . doxazosin (CARDURA) 2 MG tablet Take 2 mg by mouth daily.  Marland Kitchen galantamine (RAZADYNE ER) 8 MG 24 hr capsule Take 1 capsule (8 mg total) by mouth daily.  . Memantine HCl ER (NAMENDA XR) 28 MG CP24 Take 28 mg by mouth daily.  . metoprolol (LOPRESSOR) 50 MG tablet Take 1 tablet (50 mg total) by mouth 2 (two) times daily.  . nitroGLYCERIN (NITROSTAT) 0.4 MG SL tablet Place 0.4 mg under the tongue every 5 (five) minutes as needed.    Marland Kitchen QUEtiapine (SEROQUEL) 25 MG tablet Take 1 tablet (25 mg total) by mouth at bedtime. Take 1 pill each evening at 6 or 7 PM.  . sertraline (ZOLOFT) 50 MG tablet Take 1 tablet (50 mg total) by mouth daily.  Marland Kitchen warfarin (COUMADIN) 3 MG tablet    No facility-administered encounter medications on file as of 12/29/2014.  :  Review of Systems:  Out of a complete 14 point review of systems, all are reviewed and negative with the exception of these symptoms as listed below:   Review of Systems  Neurological:       Dementia    Objective:  Neurologic Exam  Physical Exam Physical Examination:   Filed Vitals:   12/29/14 1300  BP: 143/82  Pulse: 49   General Examination: The patient is a very pleasant 81 y.o. male in no acute distress. He is calm and cooperative with the exam.   HEENT: Normocephalic, atraumatic,  pupils are equal, round and reactive to light and accommodation. Extraocular tracking shows mild saccadic breakdown. Face is symmetric with normal facial animation and normal facial sensation. Speech is clear with no dysarthria noted. There is very mild hypophonia. There is no lip, neck or jaw tremor. Neck is supple with full range of motion. Oropharynx exam reveals normal findings except mild mouth dryness, tongue protrudes centrally and palate elevates symmetrically.   Chest: is clear to auscultation without wheezing, rhonchi or crackles noted.  Heart: sounds are regular and he does have a 3/6 systolic murmur, unchanged, pulse lower 50s.  Abdomen: is soft, non-tender and non-distended with normal bowel sounds appreciated on auscultation.  Extremities: There is 2+ pitting edema in the distal lower extremities bilaterally, up to the calves bilaterally, mild erythema is noted in the distal legs, no tenderness upon palpation is noted. The swelling is new. Pedal pulses are intact.  Skin: is warm and dry with no trophic changes noted.  Musculoskeletal: exam reveals no obvious joint deformities, tenderness or joint swelling or erythema.  Neurologically:  Mental status: The patient is awake, alert and oriented to place, circumstance, month, not day or date and week, but year and season.   In 2014: AFT is 6, CDT 3/4, MMSE is 22/30.   On 02/22/2014: MMSE is 23/30, AFT is 10, clock drawing is 2/4.  On 08/26/2014: MMSE 19/30, CDT: 2/4, AFT: 9/min.  On 12/29/2014: MMSE: 22/30, CDT: 2/4, AFT: 7/min.   There is no aphasia, agnosia, apraxia or anomia. Speech is clear with normal prosody and enunciation. Thought process is linear. Mood is congruent. Cranial nerves are as described above under HEENT exam. In addition, shoulder shrug is normal with equal shoulder height noted. Motor exam: Normal bulk, strength and tone is noted. There is no drift, tremor or rebound. Romberg is negative. Reflexes are 1+ in the  UEs and trace in both knees and absent in both ankles. Fine motor skills  are slightly slow but with intact with normal finger taps, normal hand movements, normal rapid alternating patting, normal foot taps and normal foot agility.  Cerebellar testing shows no dysmetria or intention tremor on finger to nose testing. There is no truncal or gait ataxia.  Sensory exam is intact to light touch in the upper extremities but he does have decreased vibration sense in the distal lower extremities bilaterally.   Gait, station and balance are unremarkable for age. No veering to one side is noted. No leaning to one side. Posture is age-appropriate and stance is mildly wide-based and he walks slightly insecurely but does not need any assistance. He turns in 3 steps.           Assessment and Plan:   In summary, AUBURN HERT is a very pleasant 81 year old male with a history of Alzheimer's disease with behavioral disturbance in the past. His memory scores are for the most part stable. At the last visit his MMSE was a little lower, but he was recovering from a URI at the time and had finished his Zpack at the time. He is currently on Namenda XR 28 mg strength and Razadyne ER 8 mg once daily and seroquel 25 mg each night. While neurologically he seems stable, he has been noted by his children to be more sedentary and a little bit more withdrawn. He has new lower extremity swelling which is somewhat worrisome. It may very well be a function of being on amlodipine, being more sedentary, but he does have a cardiac history and this is certainly quite prominent swelling which is new. I suggested they check with his primary care physician but he may benefit from seeing his cardiologist again as well. He has not seen cardiology and almost 2 years from what I can see. He has had a 15 pound weight gain in for him who has always been around the 200 pound mark it is quite a bit of weight gain. He had had some agitation and wandering  in the past, but not recently. Seroquel has helped with his sleep and agitation in the evenings, and he has since then been sleeping better and his behavior has been stable. I discussed with his daughter and son the diagnosis of AD, its prognosis and treatment options. We talked about medical treatments and non-pharmacological approaches. We talked about maintaining a healthy lifestyle in general. I encouraged the patient to eat healthy, exercise daily and keep well hydrated, to keep a scheduled bedtime and wake time routine. He is advised to stay active mentally and physically. He does not like to read the paper or watches the news on TV and he does not like to do word puzzles. He has always been a numbers person. He is on the maximum dose of Namenda XR, and has been on Razadyne ER 8 mg. He has had problems with medication side effects in the past. As of right now I would like for him to continue the current medication regimen including the seroquel which he has been tolerating well. I would be very cautious with increasing the Seroquel, given his age. We mutually agreed to keep his medications the same and give more prior T2 his weight gain and his water retention in his legs. I will see him back routinely in about 6 months, sooner if needed. I've asked his daughter to keep you posted as to his lower leg swelling and his weight. I answered all her questions today and they were in  agreement. I spent 25 minutes in total face-to-face time with the patient, more than 50% of which was spent in counseling and coordination of care, reviewing test results, reviewing medication and discussing or reviewing the diagnosis of dementia, its prognosis and treatment options.

## 2014-12-30 ENCOUNTER — Telehealth: Payer: Self-pay | Admitting: Cardiology

## 2014-12-30 NOTE — Telephone Encounter (Signed)
Spoke with daughter - pt was seen by neurologist yesterday and they determined he has had an increase in wt by 15 lbs and bilateral fluid retention in legs.  MD ordered pt to start Lasix and to wear compression stockings.  Daughter is wanting to know why he has to wait to be seen until 7/7 by a PA.  Advised this is the first available appt at this time and that pt is being treated appropriately.  She also reports he is having a 2 D Echo today.  Advised daughter to make sure pt is keeping feet and legs elevated above the level of his heart as much as possible.  Also advised to limit NA and fluid intake as much as possible.  She states understanding. She wants Webb Silversmith and Dr Aundra Dubin to be aware of what is going on with pt and to see if there is anyway to get his appt moved up.  Advised her I will forward information to them.

## 2014-12-30 NOTE — Telephone Encounter (Signed)
New Message  Per pt daughter calling: Pt is at [facility] in [place]- having echo and starting Lasix  (and TED hoes) today. Pt daughter made appt w/ Cecille Rubin on 7/7 and wants this info relayed to Dr. Aundra Dubin, and wants to know what to do going forward. Please call back and discuss.

## 2014-12-30 NOTE — Telephone Encounter (Signed)
Work him in to see me next week when I am at [place] or last appt.

## 2014-12-30 NOTE — Telephone Encounter (Signed)
Scheduled appt for pt as ordered.  Appt for 6/23 at 4:30 pm.  Left message on daughter's voicemail and asked her to call back to reschedule if she can't bring him.

## 2014-12-30 NOTE — Telephone Encounter (Signed)
Work him in to see me next week when I am at [facility], first or last appointment.

## 2015-01-03 ENCOUNTER — Encounter: Payer: Self-pay | Admitting: *Deleted

## 2015-01-04 ENCOUNTER — Encounter: Payer: Self-pay | Admitting: *Deleted

## 2015-01-05 ENCOUNTER — Ambulatory Visit (INDEPENDENT_AMBULATORY_CARE_PROVIDER_SITE_OTHER): Payer: Medicare Other | Admitting: Cardiology

## 2015-01-05 ENCOUNTER — Encounter: Payer: Self-pay | Admitting: Cardiology

## 2015-01-05 VITALS — BP 142/64 | HR 50 | Ht 72.0 in | Wt 210.0 lb

## 2015-01-05 DIAGNOSIS — I482 Chronic atrial fibrillation, unspecified: Secondary | ICD-10-CM

## 2015-01-05 DIAGNOSIS — I495 Sick sinus syndrome: Secondary | ICD-10-CM | POA: Diagnosis not present

## 2015-01-05 DIAGNOSIS — I2510 Atherosclerotic heart disease of native coronary artery without angina pectoris: Secondary | ICD-10-CM | POA: Diagnosis not present

## 2015-01-05 DIAGNOSIS — I350 Nonrheumatic aortic (valve) stenosis: Secondary | ICD-10-CM

## 2015-01-05 DIAGNOSIS — I5032 Chronic diastolic (congestive) heart failure: Secondary | ICD-10-CM

## 2015-01-05 MED ORDER — POTASSIUM CHLORIDE ER 10 MEQ PO TBCR: 10.0000 meq | EXTENDED_RELEASE_TABLET | Freq: Every day | ORAL | Status: AC

## 2015-01-05 MED ORDER — FUROSEMIDE 40 MG PO TABS: 40.0000 mg | ORAL_TABLET | Freq: Every day | ORAL | Status: DC

## 2015-01-05 NOTE — Patient Instructions (Signed)
Medication Instructions:  Increase lasix (furosemide) to 40mg  daily  Start KCL (potassium) 10 mEq daily.  Labwork: BMET/BNP tomorrow--I have given you a prescription for this, please fax the results to [phone]  BMET in 10 days-I have given you a prescription for this, please fax the results to [phone]  Testing/Procedures: None today  Follow-Up: Your physician recommends that you schedule a follow-up appointment in: about 3 weeks with the PA/.NP  Your physician recommends that you schedule a follow-up appointment in: 2 months with Dr Aundra Dubin.   Your physician recommends that you schedule a follow-up appointment ASAP  with EP for your [facility] pacemaker.    LIMIT YOUR SALT (sodium ) intake to 2 G (2000mg ) of sodium per day.  Use compression stockings to help the swelling in your feet and legs.Put them on in the morning and take them off at night.       Any Other Special Instructions Will Be Listed Below (If Applicable).

## 2015-01-06 ENCOUNTER — Encounter: Payer: Self-pay | Admitting: Cardiology

## 2015-01-06 ENCOUNTER — Telehealth: Payer: Self-pay | Admitting: Cardiology

## 2015-01-06 NOTE — Telephone Encounter (Signed)
New message      The assisted living facility has been given pt 50,000 units of vit D once a week.  Is this a need to be concerned?

## 2015-01-06 NOTE — Telephone Encounter (Signed)
If vitamin D level was low, needs vitamin D supplement.

## 2015-01-06 NOTE — Telephone Encounter (Signed)
Patient's daughter wanting to know if it is necessary for patient to be taking Vitamin D 50,000 units once a week. Will forward to Dr. Aundra Dubin for advise.

## 2015-01-06 NOTE — Telephone Encounter (Signed)
Left message to call back  

## 2015-01-07 DIAGNOSIS — I350 Nonrheumatic aortic (valve) stenosis: Secondary | ICD-10-CM | POA: Insufficient documentation

## 2015-01-07 DIAGNOSIS — I5032 Chronic diastolic (congestive) heart failure: Secondary | ICD-10-CM | POA: Insufficient documentation

## 2015-01-07 NOTE — Progress Notes (Signed)
Patient ID: Spencer Jones, male   DOB: [birth year], 81 y.o.   MRN: [id number] PCP: Dr. Willodean Rosenthal  81 yo with history of CAD s/p CABG, chronic atrial fibrillation, pacemaker, CKD, and dementia presents for cardiology followup.  He lives a [facility] in the memory unit.  Recently, he was noted to develop increased exertional dyspnea and lower extremity edema. Weight rose.  There was no particular trigger.  He denies dyspnea but one daughter thinks he has been short of breath.  No chest pain.  Echo was done showing preserved EF and mild to moderate AS.  He was started on Lasix 20 mg daily about 6 days ago and weight is coming down.   ECG: atrial fibrillation, V-paced.    Labs (3/11): LDL 62, HDL 60 Labs (10/11): creatinine 1.44 Labs (1/12): creatinine 1.46, LFTs normal Labs (3/12): creatinine 1.4 Labs (4/12): LDL 61, HDL 48 Labs (10/12): LDL 48, HDL 46, LFTs normal Labs (2/13): Total cholesterol 115, creatinine 1.37  PMH: 1. CAD: CABG 1992.  Myoview in 1/09 with no ischemia.  Echo (4/12): EF 50%, inferobasal hypokinesis, mild MR, mild to moderate RV dilation with mild systolic dysfunction.  2. Atrial fibrillation: Permanent. On warfarin. 3. 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  4. Dementia 5. BPH 6. Nephrolithiasis 7. HTN 8. Hyperlipidemia 9. CKD 10. Prostate cancer: Treated with radiation in 1991.  11. Gout 12. Aortic stenosis: Echo (6/16) with EF 55-60%, mild LVH, mild-moderate AS with mean gradient 14 mmHg and AVA 1.3-1.4 cm^2, mild MR, PA systolic pressure 45 mmHg.   SH: Lives with wife at [facility] in [place].  2 sons, 4 daughters.  Retired.  Rare ETOH.  Quit tobacco 1976.   FH: Heart disease.  Mother with dementia.   ROS: All systems reviewed and negative except as per HPI.    Current Outpatient Prescriptions  Medication Sig Dispense Refill  . allopurinol (ZYLOPRIM) 300 MG tablet Take 300 mg by mouth daily.     Marland Kitchen amLODipine (NORVASC) 5 MG  tablet Take 5 mg by mouth daily.    . benazepril (LOTENSIN) 20 MG tablet Take by mouth daily.     . Cholecalciferol (VITAMIN D-3 PO) Take by mouth once a week. 50,000 unit tablet , one capsule weekly on Fridays    . COLCHICINE PO Take 0.3 mg by mouth.    . doxazosin (CARDURA) 2 MG tablet Take 2 mg by mouth daily.    Marland Kitchen galantamine (RAZADYNE ER) 8 MG 24 hr capsule Take 1 capsule (8 mg total) by mouth daily. 90 capsule 3  . memantine (NAMENDA XR) 28 MG CP24 24 hr capsule Take 1 capsule (28 mg total) by mouth daily. 90 capsule 3  . metoprolol (LOPRESSOR) 50 MG tablet Take 1 tablet (50 mg total) by mouth 2 (two) times daily. 180 tablet 3  . nitroGLYCERIN (NITROSTAT) 0.4 MG SL tablet Place 0.4 mg under the tongue every 5 (five) minutes as needed.      Marland Kitchen QUEtiapine (SEROQUEL) 25 MG tablet Take 1 tablet (25 mg total) by mouth at bedtime. Take 1 pill each evening at 6 or 7 PM. 90 tablet 3  . SENNA PO Take by mouth daily.    . sertraline (ZOLOFT) 25 MG tablet Take 1 tablet by mouth every other day. Alternate between 50mg     . sertraline (ZOLOFT) 50 MG tablet Take 50 mg by mouth every other day. Alternate between 25mg     . warfarin (COUMADIN) 2.5 MG tablet Monday ,  Wednesday, Friday    . warfarin (COUMADIN) 3 MG tablet Take 3 mg by mouth. Tuesday, Thursday, Saturday, Sunday    . atorvastatin (LIPITOR) 10 MG tablet Take 10 mg by mouth daily.    . furosemide (LASIX) 40 MG tablet Take 1 tablet (40 mg total) by mouth daily. 30 tablet 2  . potassium chloride (KLOR-CON 10) 10 MEQ tablet Take 1 tablet (10 mEq total) by mouth daily. 30 tablet 2   No current facility-administered medications for this visit.    BP 142/64 mmHg  Pulse 50  Ht 6' (1.829 m)  Wt 210 lb (95.255 kg)  BMI 28.47 kg/m2 General: NAD Neck: JVP 8-9 cm, no thyromegaly or thyroid nodule.  Lungs: Clear to auscultation bilaterally with normal respiratory effort. CV: Nondisplaced PMI.  Heart irregular S1/S2, no S3/S4, 2/6 early SEM.  2+ edema  to knees bilaterally.  No carotid bruit.  Normal pedal pulses.  Abdomen: Soft, nontender, no hepatosplenomegaly, no distention.   Neurologic: Alert and oriented x 3.  Short term memory difficulty. Psych: Normal affect. Extremities: No clubbing or cyanosis.   Assessment/Plan: 1. Chronic diastolic CHF: Patient is now taking Lasix 20 mg daily.  He remains volume overloaded on exam.  ?NYHA class II-III symptoms (patient does not report dyspnea but daughters think he is short of breath).  - Increase Lasix to 40 mg daily and add KCl 20 daily.  I will check BMET/BNP today and again in 10 days.  - Wear graded compression stockings.  - < 2 g sodium diet.   2. Bradycardia: PCM present, I will arrange for PCM followup as this has not been done for a while. 3. CAD: s/p CABG.  He is on warfarin with stable CAD so no aspirin.  He has been taken off his statin.  4. Chronic atrial fibrillation: On warfarin.  5. HTN: Mildly elevated BP, continue amlodipine and metoprolol.  6. Aortic stenosis: Mild to moderate on recent echo.   Loralie Champagne 01/07/2015 12:30 AM

## 2015-01-09 NOTE — Telephone Encounter (Signed)
Pt's daughter aware of Dr Claris Gladden response.

## 2015-01-20 ENCOUNTER — Ambulatory Visit (INDEPENDENT_AMBULATORY_CARE_PROVIDER_SITE_OTHER): Payer: Medicare Other | Admitting: *Deleted

## 2015-01-20 ENCOUNTER — Encounter: Payer: Self-pay | Admitting: Nurse Practitioner

## 2015-01-20 ENCOUNTER — Ambulatory Visit (INDEPENDENT_AMBULATORY_CARE_PROVIDER_SITE_OTHER): Payer: Medicare Other | Admitting: Nurse Practitioner

## 2015-01-20 VITALS — BP 110/60 | HR 66 | Resp 20 | Ht 74.0 in | Wt 208.8 lb

## 2015-01-20 DIAGNOSIS — I5032 Chronic diastolic (congestive) heart failure: Secondary | ICD-10-CM

## 2015-01-20 DIAGNOSIS — I495 Sick sinus syndrome: Secondary | ICD-10-CM | POA: Diagnosis not present

## 2015-01-20 DIAGNOSIS — I482 Chronic atrial fibrillation, unspecified: Secondary | ICD-10-CM

## 2015-01-20 DIAGNOSIS — Z950 Presence of cardiac pacemaker: Secondary | ICD-10-CM | POA: Diagnosis not present

## 2015-01-20 DIAGNOSIS — I2510 Atherosclerotic heart disease of native coronary artery without angina pectoris: Secondary | ICD-10-CM

## 2015-01-20 LAB — BASIC METABOLIC PANEL
BUN: 27 mg/dL — ABNORMAL HIGH (ref 6–23)
CO2: 34 mEq/L — ABNORMAL HIGH (ref 19–32)
Calcium: 9.5 mg/dL (ref 8.4–10.5)
Chloride: 102 mEq/L (ref 96–112)
Creatinine, Ser: 1.72 mg/dL — ABNORMAL HIGH (ref 0.40–1.50)
GFR: 40.06 mL/min — ABNORMAL LOW (ref >60.00)
Glucose, Bld: 86 mg/dL (ref 70–99)
Potassium: 4 mEq/L (ref 3.5–5.1)
Sodium: 142 mEq/L (ref 135–145)

## 2015-01-20 LAB — CUP PACEART INCLINIC DEVICE CHECK
Battery Impedance: 2000 Ohm
Battery Voltage: 2.75 V
Brady Statistic RV Percent Paced: 45 %
Date Time Interrogation Session: 20160708162754
Lead Channel Impedance Value: 269 Ohm
Lead Channel Pacing Threshold Amplitude: 1.25 V
Lead Channel Pacing Threshold Pulse Width: 0.8 ms
Lead Channel Setting Pacing Pulse Width: 0.8 ms
Lead Channel Setting Sensing Sensitivity: 1.5 mV
MDC IDC MSMT LEADCHNL RV SENSING INTR AMPL: 5.5 mV
Pulse Gen Serial Number: 1154839

## 2015-01-20 NOTE — Progress Notes (Signed)
Pacemaker check in clinic. Normal device function. Threshold, sensing, impedance consistent with previous measurements. Device programmed to maximize longevity. No high ventricular rates noted. Device programmed at appropriate safety margins. Histogram distribution appropriate for patient activity level. Device programmed to optimize intrinsic conduction. Estimated longevity 5-5.75 years. Patient to follow up with Dr. Aundra Dubin per [facility].

## 2015-01-20 NOTE — Patient Instructions (Addendum)
We will be checking the following labs today - BMET   Medication Instructions:    Continue with your current medicines but  I am cutting the Lasix back to just 20 mg a day    Testing/Procedures To Be Arranged:  N/A  Follow-Up:   See Dr. Aundra Dubin as planned  Pacemaker check today.    Other Special Instructions:   N/A  Call the [place] office at [phone] if you have any questions, problems or concerns.

## 2015-01-20 NOTE — Progress Notes (Signed)
CARDIOLOGY OFFICE NOTE  Date:  01/20/2015    Spencer Jones Date of Birth: [birth year] Medical Record #[id number]  PCP:  Willodean Rosenthal, MD  Cardiologist:  Aundra Dubin    Chief Complaint  Patient presents with  . Congestive Heart Failure    2 week check - seen for Dr. Aundra Dubin    History of Present Illness: Spencer Jones is a 81 y.o. male who presents today for a 2 week check. Seen for Dr. Aundra Dubin. He has a history of CAD s/p CABG, chronic atrial fibrillation, pacemaker for symptomatic bradycardia, CKD, and dementia/Alzheimer's.  He lives at [facility] in the memory unit.   Seen 2 weeks ago and being noted to have developed increased exertional dyspnea and lower extremity edema. Weight was up.  There was no particular trigger. He denied dyspnea but one daughter thought he was short of breath. Echo was done showing preserved EF and mild to moderate AS. He was started on Lasix 20 mg which was increased at last visit.    Comes back today. Here with his son. He has 6 kids - this is his youngest that is here with him today. Doing ok.  Swelling is improved. Wearing support stockings. Not short of breath. Not dizzy. No recent falls. Coumadin managed by SNF. No chest pain. Son feels like he is doing ok. They would like to consider cutting back the Lasix.   PMH: 1. CAD: CABG 1992. Myoview in 1/09 with no ischemia. Echo (4/12): EF 50%, inferobasal hypokinesis, mild MR, mild to moderate RV dilation with mild systolic dysfunction.  2. Atrial fibrillation: Permanent. On warfarin. 3. 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  4. Dementia 5. BPH 6. Nephrolithiasis 7. HTN 8. Hyperlipidemia 9. CKD 10. Prostate cancer: Treated with radiation in 1991.  11. Gout 12. Aortic stenosis: Echo (6/16) with EF 55-60%, mild LVH, mild-moderate AS with mean gradient 14 mmHg and AVA 1.3-1.4 cm^2, mild MR, PA systolic pressure 45 mmHg.   Past Medical History  Diagnosis Date  .  Atrial fibrillation     permanent  . Coronary atherosclerosis of artery bypass graft 1991  . Hypertension   . Hyperlipidemia   . Prostate cancer   . Renal insufficiency   . Gout   . Tachycardia-bradycardia     s/p PPM by Dr Olevia Perches  . Alzheimer disease   . Dementia with behavioral disturbance     Past Surgical History  Procedure Laterality Date  . Coronary artery bypass graft  1991  . Colonoscopy    . Pacemaker insertion      SJM implanted by Dr Olevia Perches, most recent generator 2007  . Tonsillectomy    . Inguinal hernia repair    . Sebaceous cyst excision   2005, 2008  . Spermatocelectomy       Medications: Current Outpatient Prescriptions  Medication Sig Dispense Refill  . allopurinol (ZYLOPRIM) 300 MG tablet Take 300 mg by mouth daily.     Marland Kitchen amLODipine (NORVASC) 5 MG tablet Take 5 mg by mouth daily.    . benazepril (LOTENSIN) 20 MG tablet Take by mouth daily.     . Cholecalciferol (VITAMIN D-3 PO) Take by mouth once a week. 50,000 unit tablet , one capsule weekly on Fridays    . COLCHICINE PO Take 0.3 mg by mouth.    . doxazosin (CARDURA) 2 MG tablet Take 2 mg by mouth daily.    . furosemide (LASIX) 40 MG tablet Take 1 tablet (40 mg total)  by mouth daily. 30 tablet 2  . galantamine (RAZADYNE ER) 8 MG 24 hr capsule Take 1 capsule (8 mg total) by mouth daily. 90 capsule 3  . memantine (NAMENDA XR) 28 MG CP24 24 hr capsule Take 1 capsule (28 mg total) by mouth daily. 90 capsule 3  . metoprolol (LOPRESSOR) 50 MG tablet Take 1 tablet (50 mg total) by mouth 2 (two) times daily. 180 tablet 3  . potassium chloride (KLOR-CON 10) 10 MEQ tablet Take 1 tablet (10 mEq total) by mouth daily. 30 tablet 2  . Probiotic Product (PROBIOTIC DAILY PO) Take 1 tablet by mouth daily.    . QUEtiapine (SEROQUEL) 25 MG tablet Take 1 tablet (25 mg total) by mouth at bedtime. Take 1 pill each evening at 6 or 7 PM. 90 tablet 3  . SENNA PO Take by mouth daily.    . sertraline (ZOLOFT) 25 MG tablet Take 1  tablet by mouth every other day. Alternate between 50mg     . sertraline (ZOLOFT) 50 MG tablet Take 50 mg by mouth every other day. Alternate between 25mg     . warfarin (COUMADIN) 2.5 MG tablet Monday , Wednesday, Friday    . warfarin (COUMADIN) 3 MG tablet Take 3 mg by mouth. Tuesday, Thursday, Saturday, Sunday     No current facility-administered medications for this visit.    Allergies: Allergies  Allergen Reactions  . Amoxicillin     REACTION: unspecified  . Penicillins   . Sulfamethoxazole     REACTION: unspecified  . Tetanus Toxoid Adsorbed     REACTION: unspecified    Social History: The patient  reports that he has quit smoking. He has never used smokeless tobacco. He reports that he does not drink alcohol or use illicit drugs.   Family History: The patient's Family history is unknown by patient.   Review of Systems: Please see the history of present illness.   Otherwise, the review of systems is positive for none.   All other systems are reviewed and negative.   Physical Exam: VS:  BP 110/60 mmHg  Pulse 66  Resp 20  Ht 6\' 2"  (1.88 m)  Wt 208 lb 12.8 oz (94.711 kg)  BMI 26.80 kg/m2 .  BMI Body mass index is 26.8 kg/(m^2).  Wt Readings from Last 3 Encounters:  01/20/15 208 lb 12.8 oz (94.711 kg)  01/05/15 210 lb (95.255 kg)  12/29/14 215 lb (97.523 kg)    General: Pleasant. Well developed, well nourished and in no acute distress.  HEENT: Normal. Neck: Supple, no JVD, carotid bruits, or masses noted.  Cardiac: Pretty regular rhythm. Soft outflow murmur.  No edema.  Respiratory:  Lungs are clear to auscultation bilaterally with normal work of breathing.  GI: Soft and nontender.  MS: No deformity or atrophy. Gait and ROM intact. Skin: Warm and dry. Color is normal.  Neuro:  Strength and sensation are intact and no gross focal deficits noted.  Psych: Alert, appropriate and with normal affect.   LABORATORY DATA:  EKG:  EKG is not ordered today.   Lab  Results  Component Value Date   WBC 8.2 11/01/2009   HGB 14.3 11/01/2009   HCT 42.0 11/01/2009   PLT 127.0* 11/01/2009   GLUCOSE 96 10/02/2009   CHOL 109 05/15/2011   TRIG 78.0 05/15/2011   HDL 45.80 05/15/2011   LDLCALC 48 05/15/2011   ALT 24 05/15/2011   AST 24 05/15/2011   NA 141 10/02/2009   K 4.3 10/02/2009   CL  103 10/02/2009   CREATININE 1.4 10/02/2009   BUN 21 10/02/2009   CO2 29 10/02/2009   TSH 1.34 11/01/2009   PSA 0.40 07/14/2008   INR 2.8 05/26/2012   HGBA1C 6.1 10/02/2009    BNP (last 3 results) No results for input(s): BNP in the last 8760 hours.  ProBNP (last 3 results) No results for input(s): PROBNP in the last 8760 hours.   Other Studies Reviewed Today: Echo from 12/2014 with normal EF, mild to moderate AS and mild LVH  Assessment/Plan: 1. Chronic diastolic CHF: weight is down. I have cut his Lasix back to just 20 mg a day. Continue with compression stockings.   2. Bradycardia: PPM in place - device being checked later today in office.   3. CAD: s/p CABG. He is on warfarin with stable CAD so no aspirin. He has been taken off his statin. He denies chest pain.   4. Chronic atrial fibrillation: On warfarin. Managed with rate control.   5. HTN: BP ok on current regimen.   6. Aortic stenosis: Mild to moderate on recent echo. Would manage conservatively.   Current medicines are reviewed with the patient today.  The patient does not have concerns regarding medicines other than what has been noted above.  The following changes have been made:  See above.  Labs/ tests ordered today include:    Orders Placed This Encounter  Procedures  . Basic metabolic panel     Disposition:   FU with Dr. Aundra Dubin in August as planned.   Patient is agreeable to this plan and will call if any problems develop in the interim.   Signed: Burtis Junes, RN, ANP-C 01/20/2015 2:56 PM  [facility] [address] [place], [place]  [address] Phone: [phone] Fax: [phone]

## 2015-01-31 ENCOUNTER — Encounter: Payer: Self-pay | Admitting: Internal Medicine

## 2015-03-15 ENCOUNTER — Ambulatory Visit (INDEPENDENT_AMBULATORY_CARE_PROVIDER_SITE_OTHER): Payer: Medicare Other | Admitting: Cardiology

## 2015-03-15 ENCOUNTER — Encounter: Payer: Self-pay | Admitting: Cardiology

## 2015-03-15 VITALS — BP 108/78 | HR 50 | Ht 74.0 in | Wt 209.0 lb

## 2015-03-15 DIAGNOSIS — I2510 Atherosclerotic heart disease of native coronary artery without angina pectoris: Secondary | ICD-10-CM

## 2015-03-15 DIAGNOSIS — I5032 Chronic diastolic (congestive) heart failure: Secondary | ICD-10-CM

## 2015-03-15 DIAGNOSIS — I482 Chronic atrial fibrillation, unspecified: Secondary | ICD-10-CM

## 2015-03-15 LAB — BASIC METABOLIC PANEL
BUN: 24 mg/dL — AB (ref 6–23)
CHLORIDE: 102 meq/L (ref 96–112)
CO2: 29 meq/L (ref 19–32)
CREATININE: 1.63 mg/dL — AB (ref 0.40–1.50)
Calcium: 9.5 mg/dL (ref 8.4–10.5)
GFR: 42.61 mL/min — ABNORMAL LOW (ref >60.00)
GLUCOSE: 73 mg/dL (ref 70–99)
Potassium: 4.1 mEq/L (ref 3.5–5.1)
Sodium: 140 mEq/L (ref 135–145)

## 2015-03-15 LAB — BRAIN NATRIURETIC PEPTIDE: Pro B Natriuretic peptide (BNP): 338 pg/mL — ABNORMAL HIGH (ref 0.0–100.0)

## 2015-03-15 NOTE — Patient Instructions (Addendum)
Medication Instructions:  Stop metoprolol (lopressor)  Labwork: BMET/BNP today.  Testing/Procedures: None   Follow-Up: Your physician wants you to follow-up in: 4 months with Dr Aundra Dubin. (January 2017). You will receive a reminder letter in the mail two months in advance. If you don't receive a letter, please call our office to schedule the follow-up appointment.  Call if heart DXAJ>28 or Systolic Blood Pressure >786 consistently.  Continue compression stockings.

## 2015-03-15 NOTE — Progress Notes (Signed)
Patient ID: Spencer Jones, male   DOB: 06-09-1925, 81 y.o.   MRN: [id number] PCP: Dr. Willodean Rosenthal  81 yo with history of CAD s/p CABG, chronic atrial fibrillation, pacemaker, CKD, and dementia presents for cardiology followup.  He lives a [facility] in the memory unit.  Recently, he was noted to develop increased exertional dyspnea and lower extremity edema. Weight rose.  There was no particular trigger.  Per family, he was short of breath.  No chest pain.  Echo was done in 6/16 showing preserved EF and mild to moderate AS.  He was started on Lasix 20 mg daily and increased to 40 mg daily. At last appointment, he saw Truitt Merle and Lasix was decreased to 20 mg daily again.   He is here with his daughter.  He seems to be doing well.  Weight is stable.  He denies dyspnea and daughter has not noticed any dyspnea. No orthopnea.  No chest pain.  He is wearing compression stockings.   ECG: atrial fibrillation, V-paced with rate 50 bpm.    Labs (3/11): LDL 62, HDL 60 Labs (10/11): creatinine 1.44 Labs (1/12): creatinine 1.46, LFTs normal Labs (3/12): creatinine 1.4 Labs (4/12): LDL 61, HDL 48 Labs (10/12): LDL 48, HDL 46, LFTs normal Labs (2/13): Total cholesterol 115, creatinine 1.37 Labs (7/16): K 4, creatinine 1.72  PMH: 1. CAD: CABG 1992.  Myoview in 1/09 with no ischemia.  Echo (4/12): EF 50%, inferobasal hypokinesis, mild MR, mild to moderate RV dilation with mild systolic dysfunction.  2. Atrial fibrillation: Permanent. On warfarin. 3. 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  4. Dementia 5. BPH 6. Nephrolithiasis 7. HTN 8. Hyperlipidemia 9. CKD 10. Prostate cancer: Treated with radiation in 1991.  11. Gout 12. Aortic stenosis: Echo (6/16) with EF 55-60%, mild LVH, mild-moderate AS with mean gradient 14 mmHg and AVA 1.3-1.4 cm^2, mild MR, PA systolic pressure 45 mmHg.   SH: Lives with wife at [facility] in [place].  2 sons, 4 daughters.  Retired.  Rare  ETOH.  Quit tobacco 1976.   FH: Heart disease.  Mother with dementia.   ROS: All systems reviewed and negative except as per HPI.    Current Outpatient Prescriptions  Medication Sig Dispense Refill  . allopurinol (ZYLOPRIM) 300 MG tablet Take 300 mg by mouth daily.     Marland Kitchen amLODipine (NORVASC) 5 MG tablet Take 5 mg by mouth daily.    . benazepril (LOTENSIN) 20 MG tablet Take by mouth daily.     . Cholecalciferol (VITAMIN D3) 2000 UNITS TABS Take 2,000 Units by mouth daily.    . COLCHICINE PO Take 0.3 mg by mouth daily.     Marland Kitchen doxazosin (CARDURA) 2 MG tablet Take 2 mg by mouth daily.    . furosemide (LASIX) 20 MG tablet Take 20 mg by mouth daily.     Marland Kitchen galantamine (RAZADYNE ER) 8 MG 24 hr capsule Take 1 capsule (8 mg total) by mouth daily. 90 capsule 3  . memantine (NAMENDA XR) 28 MG CP24 24 hr capsule Take 1 capsule (28 mg total) by mouth daily. 90 capsule 3  . potassium chloride (KLOR-CON 10) 10 MEQ tablet Take 1 tablet (10 mEq total) by mouth daily. 30 tablet 2  . QUEtiapine (SEROQUEL) 25 MG tablet Take 1/2 tablet 12.5 mg by mouth daily at bedtime    . SENNA PO Take by mouth daily.    . sertraline (ZOLOFT) 25 MG tablet Take 1 tablet by mouth every  other day. Alternate between 50mg     . sertraline (ZOLOFT) 50 MG tablet Take 50 mg by mouth every other day. Alternate between 25mg     . warfarin (COUMADIN) 2.5 MG tablet Monday , Wednesday, Friday    . warfarin (COUMADIN) 3 MG tablet Take 3 mg by mouth. Tuesday, Thursday, Saturday, Sunday     No current facility-administered medications for this visit.    BP 108/78 mmHg  Pulse 50  Ht 6\' 2"  (1.88 m)  Wt 209 lb (94.802 kg)  BMI 26.82 kg/m2 General: NAD Neck: No JVD, no thyromegaly or thyroid nodule.  Lungs: Clear to auscultation bilaterally with normal respiratory effort. CV: Nondisplaced PMI.  Heart irregular S1/S2, no S3/S4, 2/6 early SEM.  1+ ankle edema.  No carotid bruit.  Normal pedal pulses.  Abdomen: Soft, nontender, no  hepatosplenomegaly, no distention.   Neurologic: Alert and oriented x 3.  Short term memory difficulty. Psych: Normal affect. Extremities: No clubbing or cyanosis.   Assessment/Plan: 1. Chronic diastolic CHF: Patient is now back to taking Lasix 20 mg daily.  Volume status now looks good, no JVD.  - Continue Lasix at 20 mg daily, check BMET/BNP today.  - Wear graded compression stockings.  - < 2 g sodium diet.   2. Bradycardia: Can stop metoprolol.  Facility should call us if HR > 90 or SBP > 140 persistently.  3. CAD: s/p CABG.  He is on warfarin with stable CAD so no aspirin.  He has been taken off his statin.  4. Chronic atrial fibrillation: On warfarin.  5. HTN: BP not elevated.   6. Aortic stenosis: Mild to moderate on recent echo. Would manage conservatively.  7. CKD: Creatinine higher on Lasix 40, will repeat today on lower dose.   Loralie Champagne 03/15/2015 1:09 PM

## 2015-04-12 ENCOUNTER — Other Ambulatory Visit: Payer: Self-pay | Admitting: Neurology

## 2015-06-20 ENCOUNTER — Encounter: Payer: Self-pay | Admitting: Neurology

## 2015-06-20 ENCOUNTER — Ambulatory Visit (INDEPENDENT_AMBULATORY_CARE_PROVIDER_SITE_OTHER): Payer: Medicare Other | Admitting: Neurology

## 2015-06-20 VITALS — BP 134/62 | HR 62 | Resp 18 | Ht 74.0 in | Wt 210.0 lb

## 2015-06-20 DIAGNOSIS — I2510 Atherosclerotic heart disease of native coronary artery without angina pectoris: Secondary | ICD-10-CM

## 2015-06-20 DIAGNOSIS — F0390 Unspecified dementia without behavioral disturbance: Secondary | ICD-10-CM

## 2015-06-20 DIAGNOSIS — R600 Localized edema: Secondary | ICD-10-CM

## 2015-06-20 DIAGNOSIS — H269 Unspecified cataract: Secondary | ICD-10-CM

## 2015-06-20 DIAGNOSIS — I482 Chronic atrial fibrillation, unspecified: Secondary | ICD-10-CM

## 2015-06-20 DIAGNOSIS — R011 Cardiac murmur, unspecified: Secondary | ICD-10-CM

## 2015-06-20 NOTE — Progress Notes (Signed)
Subjective:    Patient ID: Spencer Jones is a 81 y.o. male.  HPI     Interim history:   Spencer Jones is a very friendly 81 year old right-handed gentleman with an underlying medical history of atrial fibrillation, coronary artery disease, status post bypass, hypertension, hyperlipidemia, prostate cancer, renal insufficiency, gout, pacemaker placement for sick sinus disease and dementia, who presents for followup consultation of his Alzheimer's disease, associated with behavioral problems (in the past). He is accompanied by his youngest son today. I last saw him on 12/29/2014, at which time he felt he was doing well. He had no complaints. He had some recent issues with shortness of breath. Memory wise he was stable but his children felt that he was more withdrawn or less active overall. Appetite was about the same and he had actually gained weight. He had some new leg swelling he was on galantamine ER 8 mg daily and Namenda long-acting 28 mg daily. He continued on Seroquel 25 mg each night which have been beneficial. His MMSE was 22/30, CDT: 2/4, AFT: 7/min. for his lower extremity edema I suggested he be checked out with his primary care physician. I suggested no medication changes at the time.   Today, 06/20/2015: He reports doing okay, but he does not typically complain. Per son, there is no drastic decline, but gradual progression, no recent illness or medication changes, no recent behavior changes reported, no falls. Spencer Jones is busy with a family event planning. She is typically here and keeps up with the appointments and meds.   Previously:   I saw him on 08/26/2014, at which time his daughter reported that he had been stable for the most part. He had a recent upper respiratory infection and had just finished his Z-Pak. Both his daughters at the time felt that he was not quite back to baseline. He was more sluggish and slightly confused. We mutually agreed to monitor his memory and keep him on  Namenda long-acting and Razadyne ER at the current doses.   I saw him on 02/22/2014, at which time his daughter reported that he was doing well. His wife was in skilled nursing. He was writing a journal. He had no recent behavioral escalations. He fell in the bathroom about 2 months prior to his visit but thankfully did not have any head injury, no headaches, no loss of consciousness. I kept him on the same medications, namely long-acting Razadyne, 8 mg, and Namenda XR 28 mg strength as well as low dose Seroquel and he has been on Zoloft 50 mg generic.   I saw him on 05/19/2013, at which time Spencer Jones reported stable conditions. He did not have any recent episodes of behavioral disturbance or wandering off. Unfortunately, his wife was not doing so well. He was struggling with the fact that they cannot live together. Spencer Jones reported that he was writing a lot, such as in a journal. He does not like to do word puzzles. He rarely watches TV. He does not typically like to read the newspaper. He has always been a numbers person. He has had no side effects from his medications including the Seroquel, Razadyne ER, and Namenda XR and I kept his medications the same.  I saw him on 01/12/2013, at which time I suggested that he continue his current medications.   I first met him and his daughter Spencer Jones on 10/06/2012 and he previously followed with Spencer Jones love. Spencer Jones recommended 24-7 care and supervision. The patient has a history of  memory loss for 5 1/2 years. He has had agitation and outbursts. CT head in June 2011 showed mild generalized atrophy and chronic microvascular ischemia. He resides at [facility] in the memory care unit. He was started on Exelon patch but developed a rash and it was stopped.   He is on Namenda XR 28 mg daily and Razadyne ER 8 mg daily. He was on prn Risperdal and Ativan, d/t agitation and wandering. His wife has been in rehabilitation at the same residential facility. At the time of his  first visit with me his MMSE was 25. I kept him on the same medications and suggested a small dose of Seroquel as well as discontinuation of risperidone.   His wife is in skilled nursing and he had episodes of wandering off and was found in another unit. He has been calling his wife on the phone multiple times a day. His daughter reported relatively good results with the seroquel, which is now at 25 mg at night and I clarified orders to stop risperdal and Ativan.    His Past Medical History Is Significant For: Past Medical History  Diagnosis Date  . Atrial fibrillation     permanent  . Coronary atherosclerosis of artery bypass graft 1991  . Hypertension   . Hyperlipidemia   . Prostate cancer   . Renal insufficiency   . Gout   . Tachycardia-bradycardia     s/p PPM by Dr [facility]  . Alzheimer disease   . Dementia with behavioral disturbance     His Past Surgical History Is Significant For: Past Surgical History  Procedure Laterality Date  . Coronary artery bypass graft  1991  . Colonoscopy    . Pacemaker insertion      SJM implanted by Dr [facility], most recent generator 2007  . Tonsillectomy    . Inguinal hernia repair    . Sebaceous cyst excision   2005, 2008  . Spermatocelectomy      His Family History Is Significant For: Family History  Problem Relation Age of Onset  . Alzheimer's disease Mother   . Heart failure Father     His Social History Is Significant For: Social History   Social History  . Marital Status: Married    Spouse Name: N/A  . Number of Children: 6  . Years of Education: N/A   Social History Main Topics  . Smoking status: Former Research scientist (life sciences)  . Smokeless tobacco: Never Used  . Alcohol Use: No  . Drug Use: No  . Sexual Activity: Not on file   Other Topics Concern  . 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 Amoxicillin     REACTION: unspecified  . Penicillins   . Sulfamethoxazole     REACTION: unspecified  . Tetanus Toxoid Adsorbed     REACTION: unspecified  :   His Current Medications Are:  Outpatient Encounter Prescriptions as of 06/20/2015  Medication Sig  . allopurinol (ZYLOPRIM) 300 MG tablet Take 300 mg by mouth daily.   Marland Kitchen amLODipine (NORVASC) 5 MG tablet Take 5 mg by mouth daily.  . benazepril (LOTENSIN) 20 MG tablet Take by mouth daily.   Marland Kitchen  Cholecalciferol (VITAMIN D3) 2000 UNITS TABS Take 2,000 Units by mouth daily.  . COLCHICINE PO Take 0.3 mg by mouth daily.   Marland Kitchen doxazosin (CARDURA) 2 MG tablet Take 2 mg by mouth daily.  . furosemide (LASIX) 20 MG tablet Take 20 mg by mouth daily.   Marland Kitchen galantamine (RAZADYNE ER) 8 MG 24 hr capsule Take 1 capsule (8 mg total) by mouth daily.  Marland Kitchen NAMENDA XR 28 MG CP24 24 hr capsule TAKE 1 CAPSULE DAILY  . potassium chloride (KLOR-CON 10) 10 MEQ tablet Take 1 tablet (10 mEq total) by mouth daily.  . QUEtiapine (SEROQUEL) 25 MG tablet Take 1/2 tablet 12.5 mg by mouth daily at bedtime  . sertraline (ZOLOFT) 25 MG tablet Take 1 tablet by mouth every other day. Alternate between 13m  . sertraline (ZOLOFT) 50 MG tablet Take 50 mg by mouth every other day. Alternate between 226m . warfarin (COUMADIN) 2.5 MG tablet Monday , Wednesday, Friday  . warfarin (COUMADIN) 3 MG tablet Take 3 mg by mouth. Tuesday, Thursday, Saturday, Sunday  . [DISCONTINUED] SENNA PO Take by mouth daily.   No facility-administered encounter medications on file as of 06/20/2015.  :  Review of Systems:  Out of a complete 14 point review of systems, all are reviewed and negative with the exception of these symptoms as listed below:   Review of Systems  Cardiovascular:       Son reports that PCP addressed leg swelling since last visit here, patient  has lost weight since.   Neurological:       No new concerns per patient and son.     Objective:  Neurologic Exam  Physical Exam Physical Examination:   Filed Vitals:   06/20/15 1258  BP: 134/62  Jones: 62  Resp: 18   General Examination: The patient is a very pleasant [age].o. male in no acute distress. He is calm and cooperative with the exam.   HEENT: Normocephalic, atraumatic, pupils are equal, round and reactive to light and accommodation. He has mild bilateral ptosis, right more than left, seems worse from before. He has bilateral cataracts which appear to be quite dense on my exam. Extraocular tracking shows mild saccadic breakdown. Face is symmetric with normal facial animation and normal facial sensation. Speech is clear with no dysarthria noted. There is very mild hypophonia. There is no lip, neck or jaw tremor. Neck is supple with full range of motion. Oropharynx exam reveals normal findings except mild mouth dryness, tongue protrudes centrally and palate elevates symmetrically.   Chest: is clear to auscultation without wheezing, rhonchi or crackles noted.  Heart: sounds are regular and he does have a 3/6 systolic murmur, unchanged, Jones lower 60s.  Abdomen: is soft, non-tender and non-distended with normal bowel sounds appreciated on auscultation.  Extremities: There is trace edema in the distal lower extremities bilaterally, he has compression stockings to both lower extremities up to the knees.   Skin: is warm and dry with no trophic changes noted.  Musculoskeletal: exam reveals no obvious joint deformities, tenderness or joint swelling or erythema.  Neurologically:  Mental status: The patient is awake, alert and oriented to day, season, city, state and county.   In 2014: AFT is 6, CDT 3/4, MMSE is 22/30.   On 02/22/2014: MMSE is 23/30, AFT is 10, clock drawing is 2/4.  On 08/26/2014: MMSE 19/30, CDT: 2/4, AFT: 9/min.  On 12/29/2014: MMSE: 22/30, CDT: 2/4, AFT:  7/min.   On 06/20/2015: MMSE: 20/30, CDT: 2/4,  AFT: 6/min.   There is no aphasia, agnosia, apraxia or anomia. Speech is clear with normal prosody and enunciation. Thought process is linear. Mood is congruent. Cranial nerves are as described above under HEENT exam. In addition, shoulder shrug is normal with equal shoulder height noted. Motor exam: Normal bulk, strength and tone is noted. There is no drift, tremor or rebound. Romberg is not tested as he is slightly unsteady with narrow stance. Reflexes are 1+ in the UEs and trace in both knees and absent in both ankles. Fine motor skills are slightly slow but with intact with normal finger taps, normal hand movements, normal rapid alternating patting, normal foot taps and normal foot agility.  Cerebellar testing shows no dysmetria or intention tremor on finger to nose testing. There is no truncal or gait ataxia.  Sensory exam is intact to light touch in the upper extremities but he does have decreased vibration sense in the distal lower extremities bilaterally.   Gait, station and balance are unremarkable for age. No veering to one side is noted. No leaning to one side. Posture is age-appropriate and stance is mildly wide-based and he walks slightly insecurely but does not need any assistance. He turns in 3 steps and has slight insecurity with turns, tandem walk is not possible.           Assessment and Plan:   In summary, GRAYDON FOFANA is a very pleasant 81 year old male with an underlying medical history of atrial fibrillation, coronary artery disease, status post bypass, hypertension, hyperlipidemia, prostate cancer, renal insufficiency, gout, pacemaker placement for sick sinus disease and dementia, who presents for followup consultation of his Alzheimer's disease, associated with behavioral problems (in the past), but no recent behavioral concerns, thankfully. His memory scores today have declined a little. He continues to be on Namenda XR 28 mg  strength and Razadyne ER 8 mg once daily and seroquel 25 mg each night. Neurological exam is stable. He does appear to have bilateral cataracts. I'm not sure when he had his last eye appointment. I advised his son to check with [facility]. He also appears to have worse eyelid droop on the right. This can impair his vision and can also affect his ability to walk with confidence. I think we should continue with the current medication regimen even though his memory scores have taken a slight decline. I think we should continue to monitor. His swelling has improved. He is also on compression stockings. He has regular follow-up appointments with his cardiologist. Seroquel has helped with his sleep and agitation in the evenings, and he has since then been sleeping better and his behavior has been stable. I encouraged the patient to eat healthy, exercise daily and keep well hydrated, to keep a scheduled bedtime and wake time routine. He is advised to stay active mentally and physically. He does not like to read the paper or watches the news on TV and he does not like to do word puzzles. He has always been a numbers person. He does write in his journal. His wife is in skilled nursing and sees him usually once a week.  I explained to his son that the patient is on the maximum dose of Namenda XR, and has been on Razadyne ER 8 mg. He has had problems with medication side effects in the past. As of right now I would like for him to continue the current medication regimen including the seroquel which he has been tolerating well. I would be very  cautious with increasing the Seroquel, given his age, but we do not have a reason to change it right now. I will see him back routinely in about 6 months, sooner if needed. I answered all their questions today and the patient and his son were in agreement.  I spent 25 minutes in total face-to-face time with the patient, more than 50% of which was spent in counseling and coordination of  care, reviewing test results, reviewing medication and discussing or reviewing the diagnosis of dementia, its prognosis and treatment options.

## 2015-06-20 NOTE — Patient Instructions (Addendum)
Please make sure you see your eye doctor once a year. You have evidence of droopy eyelids and cataracts.   We will continue to monitor your memory and for now we will continue with your memory medications, namely Razadyne ER 8 mg daily and Namenda XR 28 mg once daily.

## 2015-12-19 ENCOUNTER — Encounter: Payer: Self-pay | Admitting: Neurology

## 2015-12-19 ENCOUNTER — Ambulatory Visit (INDEPENDENT_AMBULATORY_CARE_PROVIDER_SITE_OTHER): Payer: Medicare Other | Admitting: Neurology

## 2015-12-19 VITALS — BP 136/66 | HR 72 | Resp 18 | Ht 74.0 in | Wt 205.0 lb

## 2015-12-19 DIAGNOSIS — F0390 Unspecified dementia without behavioral disturbance: Secondary | ICD-10-CM

## 2015-12-19 NOTE — Patient Instructions (Signed)
Your memory and neurological exam are stable.  We will continue with your 2 memory medications at the same doses.  Follow up with me in 6 months.

## 2015-12-19 NOTE — Progress Notes (Addendum)
Subjective:    Patient ID: Spencer Jones is a 82 y.o. male.  HPI     Interim history:   Spencer Jones is a very friendly 82 year old right-handed gentleman with an underlying medical history of atrial fibrillation, coronary artery disease, status post bypass, hypertension, hyperlipidemia, prostate cancer, renal insufficiency, gout, pacemaker placement for sick sinus disease and dementia, who presents for followup consultation of his Alzheimer's disease, associated with behavioral problems (in the past), not recently. He is accompanied by his caretaker today, usually, Spencer Jones is here. I last saw him on 06/20/2015, at which time he reported doing okay, and per son, there was no drastic decline, but gradual progression of his memory loss, no recent illness or medication changes, no recent behavior changes reported, no falls. His MMSE was 20/30, CDT: 2/4, AFT: 6/min.   Today, 12/19/15: he reports No new issues. Is accompanied by her caretaker, his daughter could not make it to this appointment. Appetite okay, no recent issues per caretaker.  Previously:    I saw him on 12/29/2014, at which time he felt he was doing well. He had no complaints. He had some recent issues with shortness of breath. Memory wise he was stable but his children felt that he was more withdrawn or less active overall. Appetite was about the same and he had actually gained weight. He had some new leg swelling he was on galantamine ER 8 mg daily and Namenda long-acting 28 mg daily. He continued on Seroquel 25 mg each night which have been beneficial. His MMSE was 22/30, CDT: 2/4, AFT: 7/min. for his lower extremity edema I suggested he be checked out with his primary care physician. I suggested no medication changes at the time.   I saw him on 08/26/2014, at which time his daughter reported that he had been stable for the most part. He had a recent upper respiratory infection and had just finished his Z-Pak. Both his daughters at the  time felt that he was not quite back to baseline. He was more sluggish and slightly confused. We mutually agreed to monitor his memory and keep him on Namenda long-acting and Razadyne ER at the current doses.   I saw him on 02/22/2014, at which time his daughter reported that he was doing well. His wife was in skilled nursing. He was writing a journal. He had no recent behavioral escalations. He fell in the bathroom about 2 months prior to his visit but thankfully did not have any head injury, no headaches, no loss of consciousness. I kept him on the same medications, namely long-acting Razadyne, 8 mg, and Namenda XR 28 mg strength as well as low dose Seroquel and he has been on Zoloft 50 mg generic.   I saw him on 05/19/2013, at which time Spencer Jones reported stable conditions. He did not have any recent episodes of behavioral disturbance or wandering off. Unfortunately, his wife was not doing so well. He was struggling with the fact that they cannot live together. Spencer Jones reported that he was writing a lot, such as in a journal. He does not like to do word puzzles. He rarely watches TV. He does not typically like to read the newspaper. He has always been a numbers person. He has had no side effects from his medications including the Seroquel, Razadyne ER, and Namenda XR and I kept his medications the same.  I saw him on 01/12/2013, at which time I suggested that he continue his current medications.   I first met  him and his daughter Spencer Jones on 10/06/2012 and he previously followed with Dr. Jeneen Rinks love. Dr. Erling Cruz recommended 24-7 care and supervision. The patient has a history of memory loss for 5 1/2 years. He has had agitation and outbursts. CT head in June 2011 showed mild generalized atrophy and chronic microvascular ischemia. He resides at [facility] in the memory care unit. He was started on Exelon patch but developed a rash and it was stopped.   He is on Namenda XR 28 mg daily and Razadyne ER 8 mg daily. He was  on prn Risperdal and Ativan, d/t agitation and wandering. His wife has been in rehabilitation at the same residential facility. At the time of his first visit with me his MMSE was 25. I kept him on the same medications and suggested a small dose of Seroquel as well as discontinuation of risperidone.   His wife is in skilled nursing and he had episodes of wandering off and was found in another unit. He has been calling his wife on the phone multiple times a day. His daughter reported relatively good results with the seroquel, which is now at 25 mg at night and I clarified orders to stop risperdal and Ativan.   Her Past Medical History Is Significant For: Past Medical History  Diagnosis Date  . Atrial fibrillation ([place])     permanent  . Coronary atherosclerosis of artery bypass graft 1991  . Hypertension   . Hyperlipidemia   . Prostate cancer ([place])   . Renal insufficiency   . Gout   . Tachycardia-bradycardia [facility])     s/p PPM by Dr Olevia Perches  . Alzheimer disease   . Dementia with behavioral disturbance     His Past Surgical History Is Significant For: Past Surgical History  Procedure Laterality Date  . Coronary artery bypass graft  1991  . Colonoscopy    . Pacemaker insertion      SJM implanted by Dr Olevia Perches, most recent generator 2007  . Tonsillectomy    . Inguinal hernia repair    . Sebaceous cyst excision   2005, 2008  . Spermatocelectomy      His Family History Is Significant For: Family History  Problem Relation Age of Onset  . Alzheimer's disease Mother   . Heart failure Father     His Social History Is Significant For: Social History   Social History  . Marital Status: Married    Spouse Name: N/A  . Number of Children: 6  . Years of Education: N/A   Social History Main Topics  . Smoking status: Former Research scientist (life sciences)  . Smokeless tobacco: Never Used  . Alcohol Use: No  . Drug Use: No  . Sexual Activity: Not Asked   Other Topics Concern  . 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 Amoxicillin     REACTION: unspecified  . Penicillins   . Sulfamethoxazole     REACTION: unspecified  . Tetanus Toxoid Adsorbed     REACTION: unspecified  :   His Current Medications Are:  Outpatient Encounter Prescriptions as of 12/19/2015  Medication Sig  . allopurinol (ZYLOPRIM) 300 MG tablet Take 300 mg by  mouth daily.   Marland Kitchen amLODipine (NORVASC) 5 MG tablet Take 5 mg by mouth daily.  . benazepril (LOTENSIN) 20 MG tablet Take by mouth daily.   . Cholecalciferol (VITAMIN D3) 2000 UNITS TABS Take 2,000 Units by mouth daily.  . COLCHICINE PO Take 0.3 mg by mouth daily.   Marland Kitchen doxazosin (CARDURA) 2 MG tablet Take 2 mg by mouth daily.  . furosemide (LASIX) 20 MG tablet Take 20 mg by mouth daily.   Marland Kitchen galantamine (RAZADYNE ER) 8 MG 24 hr capsule Take 1 capsule (8 mg total) by mouth daily.  Marland Kitchen NAMENDA XR 28 MG CP24 24 hr capsule TAKE 1 CAPSULE DAILY  . potassium chloride (KLOR-CON 10) 10 MEQ tablet Take 1 tablet (10 mEq total) by mouth daily.  . QUEtiapine (SEROQUEL) 25 MG tablet Take 1/2 tablet 12.5 mg by mouth daily at bedtime  . sertraline (ZOLOFT) 25 MG tablet Take 1 tablet by mouth every other day. Alternate between 17m  . sertraline (ZOLOFT) 50 MG tablet Take 50 mg by mouth every other day. Alternate between 229m . warfarin (COUMADIN) 3 MG tablet Take 3 mg by mouth. Tuesday, Thursday, Saturday, Sunday  . warfarin (COUMADIN) 4 MG tablet   . [DISCONTINUED] warfarin (COUMADIN) 2.5 MG tablet Monday , Wednesday, Friday   No facility-administered encounter medications on file as of 12/19/2015.  :  Review of Systems:  Out of a complete 14 point review of systems, all are reviewed and negative with the exception  of these symptoms as listed below:   Review of Systems  Neurological:       No new concerns per patient.     Objective:  Neurologic Exam  Physical Exam Physical Examination:   Filed Vitals:   12/19/15 1255  BP: 136/66  Jones: 72  Resp: 18   General Examination: The patient is a very pleasant [age].o. male in no acute distress. He is calm and cooperative with the exam.   HEENT: Normocephalic, atraumatic, pupils are equal, round and reactive to light and accommodation. He has mild bilateral ptosis, bilateral cataracts, stable. Extraocular tracking shows mild saccadic breakdown. Face is symmetric with normal facial animation and normal facial sensation. Speech is clear with no dysarthria noted. There is very mild hypophonia. There is no lip, neck or jaw tremor. Neck is supple with full range of motion. Oropharynx exam reveals normal findings except mild mouth dryness, tongue protrudes centrally and palate elevates symmetrically.   Chest: is clear to auscultation without wheezing, rhonchi or crackles noted.  Heart: sounds are regular and he does have a 3/6 systolic murmur, unchanged.  Abdomen: is soft, non-tender and non-distended with normal bowel sounds appreciated on auscultation.  Extremities: There is trace edema in the distal lower extremities bilaterally, he has compression stockings to both lower extremities up to the knees.   Skin: is warm and dry with no trophic changes noted.  Musculoskeletal: exam reveals no obvious joint deformities, tenderness or joint swelling or erythema.  Neurologically:  Mental status: The patient is awake, alert and oriented to day, season, city, state and county.   In 2014: AFT is 6, CDT 3/4, MMSE is 22/30.   On 02/22/2014: MMSE is 23/30, AFT is 10, clock drawing is 2/4.  On 08/26/2014: MMSE 19/30, CDT: 2/4, AFT: 9/min.  On 12/29/2014: MMSE: 22/30, CDT: 2/4, AFT: 7/min.   On 06/20/2015: MMSE: 20/30, CDT: 2/4, AFT: 6/min.   On 12/19/2015: MMSE:  20/30, CDT: 2/4, AFT: 7/min.  There is no aphasia, agnosia,  apraxia or anomia. Speech is clear with normal prosody and enunciation. Thought process is linear. Mood is congruent. Cranial nerves are as described above under HEENT exam. In addition, shoulder shrug is normal with equal shoulder height noted. Motor exam: Normal bulk, strength and tone is noted. There is no drift, tremor or rebound. Romberg is not tested as he is slightly unsteady with narrow stance. Reflexes are 1+ in the UEs and trace in both knees and absent in both ankles. Fine motor skills are slightly slow but with intact with normal finger taps, normal hand movements, normal rapid alternating patting, normal foot taps and normal foot agility.  Cerebellar testing shows no dysmetria or intention tremor on finger to nose testing. There is no truncal or gait ataxia.  Sensory exam is intact to light touch in the upper extremities but he does have decreased vibration sense in the distal lower extremities bilaterally.   Gait, station and balance are unremarkable for age. No veering to one side is noted. No leaning to one side. Posture is age-appropriate and stance is mildly wide-based and he walks slightly insecurely but does not need any assistance. He turns in 3 steps and has slight insecurity with turns, tandem walk is not possible, tends to point feet out with walking.           Assessment and Plan:   In summary, Spencer Jones is a very pleasant 82 year old male with an underlying medical history of atrial fibrillation, coronary artery disease, status post bypass, hypertension, hyperlipidemia, prostate cancer, renal insufficiency, gout, pacemaker placement for sick sinus disease and dementia, who presents for followup consultation of his Alzheimer's disease, associated with behavioral problems (in the past), but no recent behavioral concerns, thankfully. His memory scores today have declined over time, but stable lately. He continues to be  on Namenda XR 28 mg strength and Razadyne ER 8 mg once daily and seroquel is currently 12.5 mg each night. Neurological exam is stable and today's memory scores are stable. He does appear to have bilateral cataracts. I'm not sure when he had his last eye appointment? I advised patient and caretaker, Spencer Jones, that we will continue with the current medication regimen and we should continue to monitor. His LE swelling has improved. He is also on compression stockings. Low dose seroquel has helped with his sleep and agitation in the evenings, and he has since then been sleeping better and his behavior has been stable. I encouraged the patient to eat healthy, exercise daily and keep well hydrated, to keep a scheduled bedtime and wake time routine. He is advised to stay active mentally and physically. He does not like to read the paper or watches the news on TV and he does not like to do word puzzles. He has always been a numbers person. His wife is in skilled nursing and sees him usually once a week.  He is on the maximum dose of Namenda XR, and has been on Razadyne ER 8 mg. He has had problems with medication side effects in the past. As of right now I would like for him to continue the current medication regimen including the low dose seroquel which he has been tolerating well as I understand. I would be very cautious with increasing the Seroquel, given his age, but we do not have a reason to change it right now. I will see him back routinely in about 6 months, sooner if needed. I answered all their questions today and the patient and his  caretaker were in agreement.  I spent 25 minutes in total face-to-face time with the patient, more than 50% of which was spent in counseling and coordination of care, reviewing test results, reviewing medication and discussing or reviewing the diagnosis of dementia, its prognosis and treatment options.

## 2016-01-17 ENCOUNTER — Telehealth: Payer: Self-pay | Admitting: Cardiology

## 2016-01-17 NOTE — Telephone Encounter (Signed)
New Message  Per the pt daughter [name]) would like to know if the pt can come off of coumadin for 5 days due to nose bleeds. Please call to advise.

## 2016-01-17 NOTE — Telephone Encounter (Signed)
Dr Lovena Le has said pt may hold coumadin for 5 days.

## 2016-01-17 NOTE — Telephone Encounter (Signed)
Spencer Jones, pt's daughter requesting order to hold coumadin for 5 days to be faxed to [facility] at [place] [phone]. An order signed by Dr Lovena Le stating pt may hold coumadin for 5 days has been faxed.

## 2016-01-18 ENCOUNTER — Telehealth: Payer: Self-pay | Admitting: Cardiology

## 2016-01-18 NOTE — Telephone Encounter (Signed)
New Message  Pt daughter called to get a faxed form for coumadin to be stopped for five days. Fax should be sent to Memory Care Unit [phone] Attention: Faith

## 2016-01-18 NOTE — Telephone Encounter (Signed)
This order was faxed yesterday, will forward to Medical Records to refax.

## 2016-01-19 NOTE — Telephone Encounter (Signed)
refaxed to [phone]

## 2016-01-19 NOTE — Telephone Encounter (Signed)
Correction [phone]

## 2016-01-19 NOTE — Telephone Encounter (Signed)
Follow Up:   Daughter said pt stopped Coumadin for 5 days,because of his nose bleeding. Please fax this info to [facility] [phone] Att:Fath

## 2016-01-19 NOTE — Telephone Encounter (Signed)
Called daughter back and let her know that fax is being resent to new fax number. Informed daughter that previous fax had not gone through with number provided. Patient's daughter verbalized understanding and appreciated the call back.

## 2016-06-20 ENCOUNTER — Encounter: Payer: Self-pay | Admitting: Neurology

## 2016-06-20 ENCOUNTER — Ambulatory Visit (INDEPENDENT_AMBULATORY_CARE_PROVIDER_SITE_OTHER): Payer: Medicare Other | Admitting: Neurology

## 2016-06-20 VITALS — BP 155/78 | HR 48 | Resp 20 | Ht 74.0 in | Wt 211.0 lb

## 2016-06-20 DIAGNOSIS — R269 Unspecified abnormalities of gait and mobility: Secondary | ICD-10-CM

## 2016-06-20 DIAGNOSIS — R2689 Other abnormalities of gait and mobility: Secondary | ICD-10-CM

## 2016-06-20 DIAGNOSIS — F0390 Unspecified dementia without behavioral disturbance: Secondary | ICD-10-CM

## 2016-06-20 MED ORDER — MEMANTINE HCL ER 28 MG PO CP24: 28.0000 mg | ORAL_CAPSULE | Freq: Every day | ORAL | 2 refills | Status: AC

## 2016-06-20 MED ORDER — GALANTAMINE HYDROBROMIDE ER 16 MG PO CP24: 16.0000 mg | ORAL_CAPSULE | Freq: Every day | ORAL | 3 refills | Status: DC

## 2016-06-20 NOTE — Progress Notes (Signed)
Subjective:    Patient ID: Spencer Jones is a 82 y.o. male.  HPI     Interim history:   Spencer Jones is a very friendly 82 year old right-handed gentleman with an underlying medical history of atrial fibrillation, coronary artery disease, status post bypass, hypertension, hyperlipidemia, prostate cancer, renal insufficiency, gout, pacemaker placement for sick sinus disease and dementia, who presents for followup consultation of his Alzheimer's disease, associated with behavioral problems (in the past), not recently. He is accompanied by his son today. I last saw him on 12/19/15 it which time his MMSE was 20 out of 30, I suggested we continue with his current memory medications, he has no new complaints.  Today, 06/20/2016: he reports doing okay. Son has noted very gradual decline in balance, may have bumped into the door recent, was found to have a bruise upper R arm. Per son, no additional concerns from staff at [facility]. He also called his sister, Spencer Jones, on the phone for additional information and she had no additional concerns, but did have questions about side effects with the Razadyne. Memory function has gradually declined as well.  Previously:   I saw him on 06/20/2015, at which time he reported doing okay, and per son, there was no drastic decline, but gradual progression of his memory loss, no recent illness or medication changes, no recent behavior changes reported, no falls. His MMSE was 20/30, CDT: 2/4, AFT: 6/min.   I saw him on 12/29/2014, at which time he felt he was doing well. He had no complaints. He had some recent issues with shortness of breath. Memory wise he was stable but his children felt that he was more withdrawn or less active overall. Appetite was about the same and he had actually gained weight. He had some new leg swelling he was on galantamine ER 8 mg daily and Namenda long-acting 28 mg daily. He continued on Seroquel 25 mg each night which have been beneficial. His  MMSE was 22/30, CDT: 2/4, AFT: 7/min. for his lower extremity edema I suggested he be checked out with his primary care physician. I suggested no medication changes at the time.   I saw him on 08/26/2014, at which time his daughter reported that he had been stable for the most part. He had a recent upper respiratory infection and had just finished his Z-Pak. Both his daughters at the time felt that he was not quite back to baseline. He was more sluggish and slightly confused. We mutually agreed to monitor his memory and keep him on Namenda long-acting and Razadyne ER at the current doses.   I saw him on 02/22/2014, at which time his daughter reported that he was doing well. His wife was in skilled nursing. He was writing a journal. He had no recent behavioral escalations. He fell in the bathroom about 2 months prior to his visit but thankfully did not have any head injury, no headaches, no loss of consciousness. I kept him on the same medications, namely long-acting Razadyne, 8 mg, and Namenda XR 28 mg strength as well as low dose Seroquel and he has been on Zoloft 50 mg generic.  I saw him on 05/19/2013, at which time Spencer Jones reported stable conditions. He did not have any recent episodes of behavioral disturbance or wandering off. Unfortunately, his wife was not doing so well. He was struggling with the fact that they cannot live together. Spencer Jones reported that he was writing a lot, such as in a journal. He does not like  to do word puzzles. He rarely watches TV. He does not typically like to read the newspaper. He has always been a numbers person. He has had no side effects from his medications including the Seroquel, Razadyne ER, and Namenda XR and I kept his medications the same.  I saw him on 01/12/2013, at which time I suggested that he continue his current medications.   I first met him and his daughter Spencer Jones on 10/06/2012 and he previously followed with Dr. Jeneen Rinks love. Dr. Erling Cruz recommended 24-7 care and  supervision. The patient has a history of memory loss for 5 1/2 years. He has had agitation and outbursts. CT head in June 2011 showed mild generalized atrophy and chronic microvascular ischemia. He resides at [facility] in the memory care unit. He was started on Exelon patch but developed a rash and it was stopped.   He is on Namenda XR 28 mg daily and Razadyne ER 8 mg daily. He was on prn Risperdal and Ativan, d/t agitation and wandering. His wife has been in rehabilitation at the same residential facility. At the time of his first visit with me his MMSE was 25. I kept him on the same medications and suggested a small dose of Seroquel as well as discontinuation of risperidone.   His wife is in skilled nursing and he had episodes of wandering off and was found in another unit. He has been calling his wife on the phone multiple times a day. His daughter reported relatively good results with the seroquel, which is now at 25 mg at night and I clarified orders to stop risperdal and Ativan.    His Past Medical History Is Significant For: Past Medical History:  Diagnosis Date  . Alzheimer disease   . Atrial fibrillation ([place])    permanent  . Coronary atherosclerosis of artery bypass graft 1991  . Dementia with behavioral disturbance   . Gout   . Hyperlipidemia   . Hypertension   . Prostate cancer ([place])   . Renal insufficiency   . Tachycardia-bradycardia [facility])    s/p PPM by Dr Olevia Perches    His Past Surgical History Is Significant For: Past Surgical History:  Procedure Laterality Date  . COLONOSCOPY    . CORONARY ARTERY BYPASS GRAFT  1991  . INGUINAL HERNIA REPAIR    . PACEMAKER INSERTION     SJM implanted by Dr Olevia Perches, most recent generator 2007  . sebaceous cyst excision   2005, 2008  . SPERMATOCELECTOMY    . TONSILLECTOMY      His Family History Is Significant For: Family History  Problem Relation Age of Onset  . Alzheimer's disease Mother   . Heart failure Father     His Social  History Is Significant For: Social History   Social History  . Marital status: Married    Spouse name: N/A  . Number of children: 6  . Years of education: N/A   Social History Main Topics  . Smoking status: Former Research scientist (life sciences)  . Smokeless tobacco: Never Used  . Alcohol use No  . Drug use: No  . Sexual activity: Not Asked   Other Topics Concern  . 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 Amoxicillin     REACTION: unspecified  . Penicillins   . Sulfamethoxazole     REACTION: unspecified  . Tetanus Toxoid Adsorbed     REACTION: unspecified  :   His Current Medications Are:  Outpatient Encounter Prescriptions as of 06/20/2016  Medication Sig  . allopurinol (ZYLOPRIM) 300 MG tablet Take 300 mg by mouth daily.   Marland Kitchen amLODipine (NORVASC) 5 MG tablet Take 5 mg by mouth daily.  . benazepril (LOTENSIN) 20 MG tablet Take by mouth daily.   . Cholecalciferol (VITAMIN D3) 2000 UNITS TABS Take 2,000 Units by mouth daily.  . COLCHICINE PO Take 0.3 mg by mouth daily.   Marland Kitchen doxazosin (CARDURA) 2 MG tablet Take 2 mg by mouth daily.  . furosemide (LASIX) 20 MG tablet Take 20 mg by mouth daily.   Marland Kitchen galantamine (RAZADYNE ER) 8 MG 24 hr capsule Take 1 capsule (8 mg total) by mouth daily.  Marland Kitchen NAMENDA XR 28 MG CP24 24 hr capsule TAKE 1 CAPSULE DAILY  . potassium chloride (KLOR-CON 10) 10 MEQ tablet Take 1 tablet (10 mEq total) by mouth daily.  . QUEtiapine (SEROQUEL) 25 MG tablet Take 1/2 tablet 12.5 mg by mouth daily at bedtime  . sertraline (ZOLOFT) 25 MG tablet Take 1 tablet by mouth every other day. Alternate between 34m  . sertraline (ZOLOFT) 50 MG tablet Take 50 mg by mouth every other day. Alternate between 242m . warfarin  (COUMADIN) 3 MG tablet Take 3 mg by mouth. Tuesday, Thursday, Saturday, Sunday  . warfarin (COUMADIN) 3 MG tablet Take 3.5 mg by mouth daily. Pt takes 3.33m59my mouth Monday, Wednesday, and Friday.  . [DISCONTINUED] warfarin (COUMADIN) 4 MG tablet    No facility-administered encounter medications on file as of 06/20/2016.   :  Review of Systems:  Out of a complete 14 point review of systems, all are reviewed and negative with the exception of these symptoms as listed below: Review of Systems  Neurological:       Pt presents today with his son for a memory follow up. Pt has been stable in memory, per pt's son. However, pt's balance seems to be declining. Pt is a resident of [facility] memory unit.    Objective:  Neurologic Exam  Physical Exam Physical Examination:   Vitals:   06/20/16 1042  BP: (!) 155/78  Jones: (!) 48  Resp: 20   General Examination: The patient is a very pleasant [age]o. male in no acute distress. He is calm and cooperative with the exam.   HEENT: Normocephalic, atraumatic, pupils are equal, round and reactive to light and accommodation. He has mild bilateral ptosis, bilateral cataracts, stable. Extraocular tracking shows mild saccadic breakdown. Face is symmetric with normal facial animation and normal facial sensation. Speech is clear with no dysarthria noted. There is very mild hypophonia. There is no lip, neck or jaw tremor. Neck is supple with full range of motion. Oropharynx exam reveals normal findings except mild mouth dryness, tongue protrudes centrally and palate elevates symmetrically.   Chest: is clear to auscultation without wheezing, rhonchi or crackles noted.  Heart: sounds are regular and he does have a 3/6 systolic murmur, unchanged.  Abdomen: is soft, non-tender and non-distended with normal bowel sounds appreciated on auscultation.  Extremities: There is trace edema in the distal lower extremities bilaterally, he has compression stockings to  both lower extremities up to the knees.   Skin: is warm and dry  with no trophic changes noted.  Musculoskeletal: exam reveals no obvious joint deformities, tenderness or joint swelling or erythema.  Neurologically:  Mental status: The patient is awake, alert and oriented to day, season, city, state and county.   In 2014: AFT is 6, CDT 3/4, MMSE is 22/30.   On 02/22/2014: MMSE is 23/30, AFT is 10, clock drawing is 2/4.  On 08/26/2014: MMSE 19/30, CDT: 2/4, AFT: 9/min.  On 12/29/2014: MMSE: 22/30, CDT: 2/4, AFT: 7/min.   On 06/20/2015: MMSE: 20/30, CDT: 2/4, AFT: 6/min.   On 12/19/2015: MMSE: 20/30, CDT: 2/4, AFT: 7/min.  On 06/20/2016: MMSE: 16/30, CDT 1/4, AFT: 5/min.  There is no aphasia, agnosia, apraxia or anomia. Speech is clear with normal prosody and enunciation. Thought process is linear. Mood is congruent. Cranial nerves are as described above under HEENT exam. In addition, shoulder shrug is normal with equal shoulder height noted. Motor exam: Normal bulk, strength and tone is noted. There is no drift, tremor or rebound. Romberg is not tested as he is slightly unsteady with narrow stance. Reflexes are 1+ in the UEs and trace in both knees and absent in both ankles. Fine motor skills are slightly slow but with intact with normal finger taps, normal hand movements, normal rapid alternating patting, normal foot taps and normal foot agility.  Cerebellar testing shows no dysmetria or intention tremor on finger to nose testing. There is no truncal or gait ataxia.  Sensory exam is unchanged, intact to light touch in the upper extremities.   Gait, station and balance are unremarkable for age. No veering to one side is noted. No leaning to one side. Posture is age-appropriate and stance is mildly wide-based and he walks slightly insecurely but does not need any assistance. He turns in 3 steps and has slight insecurity with turns, tandem walk is not possible, tends to point feet out with walking,  mostly unchanged.           Assessment and Plan:   In summary, DHILLON COMUNALE is a very pleasant 82 year old male with an underlying medical history of atrial fibrillation, coronary artery disease, status post bypass, hypertension, hyperlipidemia, prostate cancer, renal insufficiency, gout, pacemaker placement for sick sinus disease and dementia, who presents for followup consultation of his Alzheimer's disease, associated with behavioral problems (in the past), but no recent behavioral concerns, thankfully. His memory scores have declined over time, again a little less today. He continues to be on Namenda XR 28 mg strength and Razadyne ER 8 mg once daily and seroquel is currently 12.5 mg each night. Neurological exam is otherwise stable, but he does have gait insecurity. He has not had any physical therapy evaluation lately. I suggested we proceed with physical therapy evaluation for gait safety and gait and balance training if possible also evaluation for an assistive device such as walker or cane. We will request this at his assisted-living facility. In addition, I suggested we increase his Razadyne ER to 16 mg once daily and follow him clinically. He will continue with Namenda long-acting 28 mg once daily which is at max dose. I did advise his son as to potential side effects to look out for such as dizziness, lightheadedness, mouth dryness, and balance problems. I suggested a 4 month checkup, sooner as needed. I answered all their questions today and the patient and his son were in agreement. I spent 25 minutes in total face-to-face time with the patient, more than 50% of which was spent in counseling and  coordination of care, reviewing test results, reviewing medication and discussing or reviewing the diagnosis of dementia, its prognosis and treatment options.

## 2016-06-20 NOTE — Patient Instructions (Signed)
We will increase your Razadyne ER to 16 mg daily and keep the Namenda XR at 28 mg once daily.

## 2016-09-02 ENCOUNTER — Telehealth: Payer: Self-pay | Admitting: Neurology

## 2016-09-02 NOTE — Telephone Encounter (Signed)
Pt's daughter called saidmemantine (NAMENDA XR) 28 MG CP24 24 hr capsule was increased in January. Michela Pitcher he is very lightheaded since increasing it. Says he had a fall over the weekend at the facility where he is living. Please call her.

## 2016-09-02 NOTE — Telephone Encounter (Signed)
I spoke to pt's daughter, Spencer Jones, per [place]. Pt's daughter says that he fell over the weekend and bruised his backside. Pt's daughter is concerned that the increase in namenda may be causing dizziness and increasing his risk for falls, and is concerned that the benefits of namenda do not outweigh the risks. Pt's daughter thinks the namenda was increased after the visit with Dr. Rexene Jones in December of 2017. It appears that Dr. Rexene Jones did not increase namenda in December (it was at 28mg  XR), but did increase the razadyne to 16 mg.  Pt's daughter wants Dr. Rexene Jones to verify that the namenda was not increased in December (pt's daughter insists that pt was taking namenda 15mg  prior to that visit). Pt's daughter also wants Dr. Guadelupe Jones thoughts on if there is an increased fall risk with the increase in namenda and razadyne and is it worth it? Pt's daughter says that she has not noticed an improvement in pt's memory. Of note, pt is at the memory unit of [facility].

## 2016-09-02 NOTE — Telephone Encounter (Signed)
I spoke to pt's daughter, Belenda Cruise, per DPR, and advised her of this information. Pt's daughter says that they are thinking about doing some PT with him at [facility]. She does not wish to change his medications around at this time but will call us back with any further concerns.

## 2016-09-02 NOTE — Telephone Encounter (Signed)
Please clarify with daughter that he has been on long-acting Namenda 28 mg once daily for the past at least 2 years. We increased the Razadyne from 8 mg to 16 mg last time because of decline in memory scores and there was room for the Razadyne to be increased.   In fact, he was on long-acting Namenda 28 mg once daily and Razadyne ER 8 mg daily when he was first seen by me in March 2014.   Fall risk is real and is likely due to a number of reasons: Normal aging, hydration and nutrition status, taking multiple medications, how well rested he has been, etc. If daughter wants the medication ie the Razadyne reduced back to 8 mg we can, not sure, if his fall risk will change.

## 2016-09-06 ENCOUNTER — Telehealth: Payer: Self-pay | Admitting: Neurology

## 2016-09-06 NOTE — Telephone Encounter (Signed)
Patients daughter [name] (listed on DPR) has called office in reference to patient being on Seroquel 12.5mg .  Patients daughter would like Dr. Guadelupe Sabin thought's on patient taking medication.  Please call

## 2016-09-06 NOTE — Telephone Encounter (Signed)
Pt's daughter returned my call. Pt's daughter says that the SNF is giving him seroquel 12.5mg  qhs. She does not know why and who is prescribing it. She does not want him on medications that will "dope him up" but is reluctant to tell the SNF to stop giving it to pt until she gets opinions from pt's physicians. I asked pt's daughter to discuss with the SNF who is prescribing the seroquel and why. She will call me back on Monday with this information.  Meanwhile, pt's daughter is asking for Dr. Guadelupe Sabin opinions on the benefits/concerns of pt taking seroquel.

## 2016-09-06 NOTE — Telephone Encounter (Signed)
I called pt's daughter to discuss. No answer, left a message asking her to call me back.

## 2016-09-17 ENCOUNTER — Telehealth: Payer: Self-pay | Admitting: Neurology

## 2016-09-17 DIAGNOSIS — F0390 Unspecified dementia without behavioral disturbance: Secondary | ICD-10-CM

## 2016-09-17 NOTE — Telephone Encounter (Signed)
Nikki with [facility] Rodney at [facility] called in reference to patients galantamine (RAZADYNE ER) 16 MG 24 hr capsule.  Daughter has voiced her concern about patient is being more tired and his gait and not sure if the increase in the medication in December could be effecting patient. Please call if Lexine Baton, but if Lexine Baton is  not available Lamona Curl will be available to speak with (charge nurse).  Please call

## 2016-09-19 NOTE — Telephone Encounter (Signed)
I called. Receptionist states that the 2 people below are not here today. She advised that I call back when Spencer Jones is there tomorrow.

## 2016-09-20 MED ORDER — GALANTAMINE HYDROBROMIDE ER 8 MG PO CP24: 8.0000 mg | ORAL_CAPSULE | Freq: Every day | ORAL | 3 refills | Status: AC

## 2016-09-20 MED ORDER — GALANTAMINE HYDROBROMIDE ER 8 MG PO CP24: 8.0000 mg | ORAL_CAPSULE | Freq: Every day | ORAL | 3 refills | Status: DC

## 2016-09-20 NOTE — Addendum Note (Signed)
Addended by: Laurence Spates on: 09/20/2016 12:03 PM   Modules accepted: Orders

## 2016-09-20 NOTE — Telephone Encounter (Signed)
Printed and faxed in

## 2016-09-20 NOTE — Addendum Note (Signed)
Addended by: Star Age on: 09/20/2016 11:43 AM   Modules accepted: Orders

## 2016-09-20 NOTE — Telephone Encounter (Signed)
Can you print off galantamine Rx for 8 mg, I changed the Rx, but could not print, please fax to [facility]

## 2016-09-20 NOTE — Telephone Encounter (Signed)
I spoke to [facility], daughter raised concerns about patient seeming more sleepy after starting Galantamine 16mg .  Lexine Baton confirmed that patient does appear to be more sleepy and dozes of more than normal since the increase.  They ask if patient can go back to 8 mg, new orders can be faxed to [phone]

## 2016-10-08 ENCOUNTER — Telehealth: Payer: Self-pay | Admitting: Nurse Practitioner

## 2016-10-08 NOTE — Telephone Encounter (Signed)
spoke with patient daughter about scheduling a past due pacer check. She said she would talk with the family because pt has Alzheimers.

## 2016-10-21 ENCOUNTER — Encounter (INDEPENDENT_AMBULATORY_CARE_PROVIDER_SITE_OTHER): Payer: Self-pay

## 2016-10-21 ENCOUNTER — Ambulatory Visit (INDEPENDENT_AMBULATORY_CARE_PROVIDER_SITE_OTHER): Payer: Medicare Other | Admitting: Neurology

## 2016-10-21 ENCOUNTER — Encounter: Payer: Self-pay | Admitting: Neurology

## 2016-10-21 VITALS — BP 141/75 | HR 51 | Resp 20 | Ht 74.0 in | Wt 211.0 lb

## 2016-10-21 DIAGNOSIS — R011 Cardiac murmur, unspecified: Secondary | ICD-10-CM

## 2016-10-21 DIAGNOSIS — F0390 Unspecified dementia without behavioral disturbance: Secondary | ICD-10-CM

## 2016-10-21 DIAGNOSIS — R269 Unspecified abnormalities of gait and mobility: Secondary | ICD-10-CM | POA: Diagnosis not present

## 2016-10-21 NOTE — Patient Instructions (Addendum)
We will keep your medicine the same. Use your cane for gait safety.  Keep doing a good job with your walking and good hydration and good nutrition.  We will do our next FU in about 6 months.

## 2016-10-21 NOTE — Progress Notes (Signed)
Subjective:    Patient ID: Spencer Jones is a 83 y.o. male.  HPI      Interim history:   Spencer Jones is an 83 year old right-handed gentleman with an underlying medical history of atrial fibrillation, coronary artery disease, status post bypass, hypertension, hyperlipidemia, prostate cancer, renal insufficiency, gout, pacemaker placement for sick sinus disease and dementia, who presents for followup consultation of his Alzheimer's disease, associated with behavioral problems (in the past), not recently. He is accompanied by his daughter again today. I last saw him on 06/20/2016, at which time his son reported a gradual decline in his balance. His MMSE was 16 out of 30 which was lower than before. I suggested we increase his Razadyne to 16 mg daily and keep his Namenda long-acting at the same dose. His daughter called in the interim reporting that he seemed to be more off balance. She was worried about the Namenda long-acting but she was advised that that had stayed the same for at least 2 years. She not was advised that we could reduce his Razadyne again. She also had questions about the Seroquel.   Today, 10/21/2016 (all dictated new, as well as above notes, some dictation done in note pad or Word, outside of chart, may appear as copied):   Today he reports no new issues, he is minimally verbal and history is provided by his daughter. He had no recent falls but has had significant side effects with increase in Razadyne including lethargy and balance problems. He is back on Razadyne 8 mg daily. He continues to be on low-dose Seroquel at night. I reminded his daughter that we started this about 4 years ago when he was on Risperdal and Ativan at the time. I did remind her that Seroquel and other antipsychotic medications in the elderly to carry the risk for increase cardiac complications in the elderly including sudden cardiac death. Nevertheless, he has been stable on a very low dose of Seroquel and has  been sleeping better and has done well with it. Daughter is agreeable to continue with the current dose at bedtime only. No recent mood related changes, no behavioral changes. Appetite is good, he walks with a cane. Had issues with persistent nosebleed recently per daughter. Had to temporarily stop the Coumadin.  The patient's allergies, current medications, family history, past medical history, past social history, past surgical history and problem list were reviewed and updated as  appropriate.   Previously (copied from previous notes for reference):   I saw him on 12/19/15 it which time his MMSE was 20 out of 30, I suggested we continue with his current memory medications, he has no new complaints.   I saw him on 06/20/2015, at which time he reported doing okay, and per son, there was no drastic decline, but gradual progression of his memory loss, no recent illness or medication changes, no recent behavior changes reported, no falls. His MMSE was 20/30, CDT: 2/4, AFT: 6/min.    I saw him on 12/29/2014, at which time he felt he was doing well. He had no complaints. He had some recent issues with shortness of breath. Memory wise he was stable but his children felt that he was more withdrawn or less active overall. Appetite was about the same and he had actually gained weight. He had some new leg swelling he was on galantamine ER 8 mg daily and Namenda long-acting 28 mg daily. He continued on Seroquel 25 mg each night which have been beneficial. His MMSE  was 22/30, CDT: 2/4, AFT: 7/min. for his lower extremity edema I suggested he be checked out with his primary care physician. I suggested no medication changes at the time.    I saw him on 08/26/2014, at which time his daughter reported that he had been stable for the most part. He had a recent upper respiratory infection and had just finished his Z-Pak. Both his daughters at the time felt that he was not quite back to baseline. He was more sluggish and  slightly confused. We mutually agreed to monitor his memory and keep him on Namenda long-acting and Razadyne ER at the current doses.    I saw him on 02/22/2014, at which time his daughter reported that he was doing well. His wife was in skilled nursing. He was writing a journal. He had no recent behavioral escalations. He fell in the bathroom about 2 months prior to his visit but thankfully did not have any head injury, no headaches, no loss of consciousness. I kept him on the same medications, namely long-acting Razadyne, 8 mg, and Namenda XR 28 mg strength as well as low dose Seroquel and he has been on Zoloft 50 mg generic.   I saw him on 05/19/2013, at which time [name] reported stable conditions. He did not have any recent episodes of behavioral disturbance or wandering off. Unfortunately, his wife was not doing so well. He was struggling with the fact that they cannot live together. [name] reported that he was writing a lot, such as in a journal. He does not like to do word puzzles. He rarely watches TV. He does not typically like to read the newspaper. He has always been a numbers person. He has had no side effects from his medications including the Seroquel, Razadyne ER, and Namenda XR and I kept his medications the same.   I saw him on 01/12/2013, at which time I suggested that he continue his current medications.   I first met him and his daughter [name] on 10/06/2012 and he previously followed with Dr. Jeneen Rinks love. Dr. Erling Cruz recommended 24-7 care and supervision. The patient has a history of memory loss for 5 1/2 years. He has had agitation and outbursts. CT head in June 2011 showed mild generalized atrophy and chronic microvascular ischemia. [facility] resides at [facility] in the memory care unit. He was started on Exelon patch but developed a rash and it was stopped.   He is on Namenda XR 28 mg daily and Razadyne ER 8 mg daily. He was on prn Risperdal and Ativan, d/t agitation and wandering. His wife has  been in rehabilitation at the same residential facility. At the time of his first visit with me his MMSE was 25. I kept him on the same medications and suggested a small dose of Seroquel as well as discontinuation of risperidone.   His wife is in skilled nursing and he had episodes of wandering off and was found in another unit. He has been calling his wife on the phone multiple times a day. His daughter reported relatively good results with the seroquel, which is now at 25 mg at night and I clarified orders to stop risperdal and Ativan.    Her Past Medical History Is Significant For: Past Medical History:  Diagnosis Date  . Alzheimer disease   . Atrial fibrillation ([place])    permanent  . Coronary atherosclerosis of artery bypass graft 1991  . Dementia with behavioral disturbance   . Gout   . Hyperlipidemia   .  Hypertension   . Prostate cancer ([place])   . Renal insufficiency   . Tachycardia-bradycardia [facility])    s/p PPM by Dr Olevia Perches    His Past Surgical History Is Significant For: Past Surgical History:  Procedure Laterality Date  . COLONOSCOPY    . CORONARY ARTERY BYPASS GRAFT  1991  . INGUINAL HERNIA REPAIR    . PACEMAKER INSERTION     SJM implanted by Dr Olevia Perches, most recent generator 2007  . sebaceous cyst excision   2005, 2008  . SPERMATOCELECTOMY    . TONSILLECTOMY      His Family History Is Significant For: Family History  Problem Relation Age of Onset  . Alzheimer's disease Mother   . Heart failure Father     His Social History Is Significant For: Social History   Social History  . Marital status: Married    Spouse name: N/A  . Number of children: 6  . Years of education: N/A   Social History Main Topics  . Smoking status: Former Research scientist (life sciences)  . Smokeless tobacco: Never Used  . Alcohol use No  . Drug use: No  . Sexual activity: Not Asked   Other Topics Concern  . 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 Amoxicillin     REACTION: unspecified  . Penicillins   . Sulfamethoxazole     REACTION: unspecified  . Tetanus Toxoid Adsorbed     REACTION: unspecified  :   His Current Medications Are:  Outpatient Encounter Prescriptions as of 10/21/2016  Medication Sig  . allopurinol (ZYLOPRIM) 300 MG tablet Take 300 mg by mouth daily.   Marland Kitchen amLODipine (NORVASC) 5 MG tablet Take 5 mg by mouth daily.  . benazepril (LOTENSIN) 20 MG tablet Take by mouth daily.   . Cholecalciferol (VITAMIN D3) 2000 UNITS TABS Take 2,000 Units by mouth daily.  . COLCHICINE PO Take 0.3 mg by mouth daily.   Marland Kitchen doxazosin (CARDURA) 2 MG tablet Take 2 mg by mouth daily.  . furosemide (LASIX) 20 MG tablet Take 20 mg by mouth daily.   Marland Kitchen galantamine (RAZADYNE ER) 8 MG 24 hr capsule Take 1 capsule (8 mg total) by mouth daily with breakfast.  . memantine (NAMENDA XR) 28 MG CP24 24 hr capsule Take 1 capsule (28 mg total) by mouth daily.  . potassium chloride (KLOR-CON 10) 10 MEQ tablet Take 1 tablet (10 mEq total) by mouth daily.  . QUEtiapine (SEROQUEL) 25 MG tablet Take 1/2 tablet 12.5 mg by mouth daily at bedtime  . sertraline (ZOLOFT) 25 MG tablet Take 1 tablet by mouth every other day. Alternate between 62m  . sertraline (ZOLOFT) 50 MG tablet Take 50 mg by mouth every other day. Alternate between 266m . warfarin (COUMADIN) 3 MG tablet Take 3 mg by mouth. Tuesday, Thursday, Saturday, Sunday  . warfarin (COUMADIN) 3 MG tablet Take 3.5 mg by mouth daily. Pt takes 3.64m49my mouth Monday, Wednesday, and Friday.   No facility-administered encounter medications on file as of 10/21/2016.   :  Review of Systems:  Out of a complete 14 point review of systems, all are reviewed and negative with the exception  of these symptoms as  listed below:  Review of Systems  Neurological:       Pt had a fall in February. Pt is taking razadyne 36m instead of 16 mg. Pt is walking with a cane.    Objective:  Neurologic Exam  Physical Exam Physical Examination:   Vitals:   10/21/16 1021  BP: (!) 141/75  Pulse: (!) 51  Resp: 20   General Examination: The patient is a very pleasant 83y.o. male in no acute distress. He appears well-developed and well-nourished and well groomed.   HEENT: Normocephalic, atraumatic, pupils are equal, round and reactive to light and accommodation. He has stable bilateral cataracts. He has mild difficulty tracking. Airway examination reveals no significant mouth dryness. Otherwise stable findings, hearing is grossly intact. Speech is not dysarthric.  Chest: Clear to auscultation without wheezing, rhonchi or crackles noted.  Heart: S1+S2+0, regular and stable systolic murmur.   Abdomen: Soft, non-tender and non-distended with normal bowel sounds appreciated on auscultation.  Extremities: There is no pitting edema in the distal lower extremities bilaterally.   Skin: Warm and dry without trophic changes noted.   Musculoskeletal: exam reveals no obvious joint deformities, tenderness or joint swelling or erythema.   Neurologically:   Mental status: The patient is awake, alert and pays good attention. Minimally verbal, answering questions appropriately.  Mood is normal, affect is normal.   In 2014: AFT is 6, CDT 3/4, MMSE is 22/30.   On 02/22/2014: MMSE is 23/30, AFT is 10, clock drawing is 2/4.  On 08/26/2014: MMSE 19/30, CDT: 2/4, AFT: 9/min.  On 12/29/2014: MMSE: 22/30, CDT: 2/4, AFT: 7/min.   On 06/20/2015: MMSE: 20/30, CDT: 2/4, AFT: 6/min.   On 12/19/2015: MMSE: 20/30, CDT: 2/4, AFT: 7/min.  On 06/20/2016: MMSE: 16/30, CDT 1/4, AFT: 5/min.  Cranial nerves II - XII are as described above under HEENT exam. In addition: shoulder shrug is normal with equal shoulder height  noted. Motor exam: Normal bulk, strength for age and normal tone. He has no tremor. Reflexes are 1+ in the upper extremities and diminished in the lower extremities, Romberg is not testable safely. Fine motor skills are globally mildly impaired but stable.  Sensory exam: intact to light touch in the upper and lower extremities.  Gait, station and balance: He stands with mild difficulty and has to push himself up but requires no assistance. His posture is fairly age-appropriate and he walks slowly and cautiously with his cane. Tandem walk is not possible. He turns in 3 steps.   Assessment and Plan:   In summary, SESPN ZEMANis a very pleasant 83y.o.-year old male with an underlying medical history of atrial fibrillation, coronary artery disease, status post bypass, hypertension, hyperlipidemia, prostate cancer, renal insufficiency, gout, pacemaker placement for sick sinus disease and dementia, who presents for followup consultation of his Alzheimer's disease, associated with behavioral problems (in the past), but no recent behavioral concerns. His memory scores have declined over time, and he continues to be on long-acting Namenda 28 mg once daily, and we try to increase his Razadyne ER to 16 mg but he could not tolerate it and had side effects including lethargy and more gait difficulty and balance problems. Therefore Razadyne long-acting was decreased to 8 mg. He continues to take low-dose Seroquel at night which has helped him with nighttime agitation and sleep, he has been able to tolerate this for the past nearly 4 years and has had no side effects. We will continue at  this time with his current medication regimen. We mutually agreed to follow him less frequently for his memory as he has done reasonably well on the current regimen and there are no major new issues lately. I suggested a 6 month follow-up, sooner if needed. His daughter has been very good about calling for acute issues or problems or  questions.  I answered all their questions today and the patient and his daughter were in agreement.  I spent 25 minutes in total face-to-face time with the patient, more than 50% of which was spent in counseling and coordination of care, reviewing test results, reviewing medication and discussing or reviewing the diagnosis of dementia, its prognosis and treatment options. Pertinent laboratory and imaging test results that were available during this visit with the patient were reviewed by me and considered in my medical decision making (see chart for details).

## 2017-04-22 ENCOUNTER — Ambulatory Visit: Payer: Medicare Other | Admitting: Neurology

## 2017-05-08 ENCOUNTER — Telehealth: Payer: Self-pay

## 2017-05-08 NOTE — Telephone Encounter (Signed)
I spoke with Dr. Rexene Alberts. She is agreeable to seeing the pt 6 months from now or just PRN (as long as PCP is ok prescribing pt's meds).   I called pt's daughter, Belenda Cruise, and advised her of this. She reports no change in the pt, he is doing well on the current medication regimen. She is asking for a 6 month follow up. An appt was made for 10/14/17 at 10:30am. Pt's daughter verbalized understanding of new appt date and time and that the 10/30 appt will be cancelled.

## 2017-05-08 NOTE — Telephone Encounter (Signed)
Patient's daughter is calling wanting to know if her father needs to keep his OV next week. She was thinking that her last conversation with Dr. Rexene Alberts was that if he was stable and the meds were working, he didn't need to follow up anymore, but she just wanted to make sure before cancelling.

## 2017-05-13 ENCOUNTER — Ambulatory Visit: Payer: Medicare Other | Admitting: Neurology

## 2017-10-14 ENCOUNTER — Encounter: Payer: Self-pay | Admitting: Neurology

## 2017-10-14 ENCOUNTER — Ambulatory Visit (INDEPENDENT_AMBULATORY_CARE_PROVIDER_SITE_OTHER): Payer: Medicare Other | Admitting: Neurology

## 2017-10-14 VITALS — BP 161/80 | HR 50 | Ht 74.0 in | Wt 207.0 lb

## 2017-10-14 DIAGNOSIS — F0390 Unspecified dementia without behavioral disturbance: Secondary | ICD-10-CM

## 2017-10-14 NOTE — Progress Notes (Signed)
Subjective:    Patient ID: Spencer Jones is a 84 y.o. male.  HPI    Interim history:   Spencer Jones is an 84 year old right-handed gentleman with an underlying medical history of atrial fibrillation, coronary artery disease, status post bypass, hypertension, hyperlipidemia, prostate cancer, renal insufficiency, gout, pacemaker placement for sick sinus disease and dementia, who presents for followup consultation of his Alzheimer's disease, associated with behavioral problems (in the past), not recently. He is accompanied by his daughter, Spencer Jones, and his youngest son today. I last saw him on 10/21/2016, at which time we mutually agreed to keep his Razadyne the same at 8 mg once daily, continue with the low-dose Seroquel and keep his long-acting Namenda the same.  Today, 10/14/2017 (all dictated new, as well as above notes, some dictation done in note pad or Word, outside of chart, may appear as copied):    He reports doing well, reports very little on his own, daughter and his son provide the history. No Recent illness thankfully her family. He has a good appetite, tries to stay active, they have activities at [facility]. Wife is in skilled nursing. No recent change in medications. Overall, there is slow decline in his cognitive function which is expected.   The patient's allergies, current medications, family history, past medical history, past social history, past surgical history and problem list were reviewed and updated as  appropriate.    Previously (copied from previous notes for reference):   I saw him on 06/20/2016, at which time his son reported a gradual decline in his balance. His MMSE was 16 out of 30 which was lower than before. I suggested we increase his Razadyne to 16 mg daily and keep his Namenda long-acting at the same dose. His daughter called in the interim reporting that he seemed to be more off balance. She was worried about the Namenda long-acting but she was advised that that  had stayed the same for at least 2 years. She not was advised that we could reduce his Razadyne again. She also had questions about the Seroquel.      I saw him on 12/19/15 it which time his MMSE was 20 out of 30, I suggested we continue with his current memory medications, he has no new complaints.   I saw him on 06/20/2015, at which time he reported doing okay, and per son, there was no drastic decline, but gradual progression of his memory loss, no recent illness or medication changes, no recent behavior changes reported, no falls. His MMSE was 20/30, CDT: 2/4, AFT: 6/min.    I saw him on 12/29/2014, at which time he felt he was doing well. He had no complaints. He had some recent issues with shortness of breath. Memory wise he was stable but his children felt that he was more withdrawn or less active overall. Appetite was about the same and he had actually gained weight. He had some new leg swelling he was on galantamine ER 8 mg daily and Namenda long-acting 28 mg daily. He continued on Seroquel 25 mg each night which have been beneficial. His MMSE was 22/30, CDT: 2/4, AFT: 7/min. for his lower extremity edema I suggested he be checked out with his primary care physician. I suggested no medication changes at the time.    I saw him on 08/26/2014, at which time his daughter reported that he had been stable for the most part. He had a recent upper respiratory infection and had just finished his Z-Pak. Both  his daughters at the time felt that he was not quite back to baseline. He was more sluggish and slightly confused. We mutually agreed to monitor his memory and keep him on Namenda long-acting and Razadyne ER at the current doses.    I saw him on 02/22/2014, at which time his daughter reported that he was doing well. His wife was in skilled nursing. He was writing a journal. He had no recent behavioral escalations. He fell in the bathroom about 2 months prior to his visit but thankfully did not have any  head injury, no headaches, no loss of consciousness. I kept him on the same medications, namely long-acting Razadyne, 8 mg, and Namenda XR 28 mg strength as well as low dose Seroquel and he has been on Zoloft 50 mg generic.   I saw him on 05/19/2013, at which time Spencer Jones reported stable conditions. He did not have any recent episodes of behavioral disturbance or wandering off. Unfortunately, his wife was not doing so well. He was struggling with the fact that they cannot live together. Spencer Jones reported that he was writing a lot, such as in a journal. He does not like to do word puzzles. He rarely watches TV. He does not typically like to read the newspaper. He has always been a numbers person. He has had no side effects from his medications including the Seroquel, Razadyne ER, and Namenda XR and I kept his medications the same.   I saw him on 01/12/2013, at which time I suggested that he continue his current medications.   I first met him and his daughter Spencer Jones on 10/06/2012 and he previously followed with Dr. Jeneen Rinks love. Dr. Erling Cruz recommended 24-7 care and supervision. The patient has a history of memory loss for 5 1/2 years. He has had agitation and outbursts. CT head in June 2011 showed mild generalized atrophy and chronic microvascular ischemia. He resides at [facility] in the memory care unit. He was started on Exelon patch but developed a rash and it was stopped.   He is on Namenda XR 28 mg daily and Razadyne ER 8 mg daily. He was on prn Risperdal and Ativan, d/t agitation and wandering. His wife has been in rehabilitation at the same residential facility. At the time of his first visit with me his MMSE was 25. I kept him on the same medications and suggested a small dose of Seroquel as well as discontinuation of risperidone.   His wife is in skilled nursing and he had episodes of wandering off and was found in another unit. He has been calling his wife on the phone multiple times a day. His daughter  reported relatively good results with the seroquel, which is now at 25 mg at night and I clarified orders to stop risperdal and Ativan.    His Past Medical History Is Significant For: Past Medical History:  Diagnosis Date  . Alzheimer disease   . Atrial fibrillation ([place])    permanent  . Coronary atherosclerosis of artery bypass graft 1991  . Dementia with behavioral disturbance   . Gout   . Hyperlipidemia   . Hypertension   . Prostate cancer ([place])   . Renal insufficiency   . Tachycardia-bradycardia [facility])    s/p PPM by Dr Olevia Perches    His Past Surgical History Is Significant For: Past Surgical History:  Procedure Laterality Date  . COLONOSCOPY    . CORONARY ARTERY BYPASS GRAFT  1991  . INGUINAL HERNIA REPAIR    . PACEMAKER  INSERTION     SJM implanted by Dr Olevia Perches, most recent generator 2007  . sebaceous cyst excision   2005, 2008  . SPERMATOCELECTOMY    . TONSILLECTOMY      His Family History Is Significant For: Family History  Problem Relation Age of Onset  . Alzheimer's disease Mother   . Heart failure Father     His Social History Is Significant For: Social History   Socioeconomic History  . Marital status: Married    Spouse name: Not on file  . Number of children: 6  . Years of education: Not on file  . Highest education level: Not on file  Occupational History  . Not on file  Social Needs  . Financial resource strain: Not on file  . Food insecurity:    Worry: Not on file    Inability: Not on file  . Transportation needs:    Medical: Not on file    Non-medical: Not on file  Tobacco Use  . Smoking status: Former Research scientist (life sciences)  . Smokeless tobacco: Never Used  Substance and Sexual Activity  . Alcohol use: No  . Drug use: No  . Sexual activity: Not on file  Lifestyle  . Physical activity:    Days per week: Not on file    Minutes per session: Not on file  . Stress: Not on file  Relationships  . Social connections:    Talks on phone: Not on file    Gets  together: Not on file    Attends religious service: Not on file    Active member of club or organization: Not on file    Attends meetings of clubs or organizations: Not on file    Relationship status: Not on file  Other Topics Concern  . 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 Amoxicillin     REACTION: unspecified  . Penicillins   . Sulfamethoxazole     REACTION: unspecified  . Tetanus Toxoid Adsorbed     REACTION: unspecified  :   His Current Medications Are:  Outpatient Encounter Medications as of 10/14/2017  Medication Sig  . allopurinol (ZYLOPRIM) 300 MG tablet Take 300 mg by mouth daily.   Marland Kitchen amLODipine (NORVASC) 5 MG tablet Take 5 mg by mouth daily.  . benazepril (LOTENSIN) 20 MG tablet Take by mouth daily.   . Cholecalciferol (VITAMIN D3) 2000 UNITS TABS Take 2,000 Units by mouth daily.  . COLCHICINE PO Take 0.3 mg by mouth daily.   Marland Kitchen doxazosin (CARDURA) 2 MG tablet Take 2 mg by mouth daily.  . furosemide (LASIX) 20 MG tablet Take 20 mg by mouth daily.   Marland Kitchen galantamine (RAZADYNE ER) 8 MG 24 hr capsule Take 1 capsule (8 mg total) by mouth daily with breakfast.  . memantine (NAMENDA XR) 28 MG CP24 24 hr capsule Take 1 capsule (28 mg total) by mouth daily.  . potassium chloride (KLOR-CON 10) 10 MEQ tablet Take 1 tablet (10 mEq total) by mouth daily.  . QUEtiapine (SEROQUEL) 25 MG tablet Take 1/2 tablet 12.5 mg by mouth daily at bedtime  . sertraline (ZOLOFT) 25 MG tablet Take 1 tablet by  mouth every other day. Alternate between 37m  . sertraline (ZOLOFT) 50 MG tablet Take 50 mg by mouth every other day. Alternate between 230m . warfarin (COUMADIN) 3 MG tablet Take 3 mg by mouth. Tuesday, Thursday, Saturday,  Sunday  . warfarin (COUMADIN) 3 MG tablet Take 3.5 mg by mouth daily. Pt takes 3.79m41my mouth Monday, Wednesday, and Friday.   No facility-administered encounter medications on file as of 10/14/2017.   :  Review of Systems:  Out of a complete 14 point review of systems, all are reviewed and negative with the exception of these symptoms as listed below: Review of Systems  Neurological:       Pt presents today to discuss his memory.     Objective:  Neurological Exam  Physical Exam Physical Examination:   Vitals:   10/14/17 1043  BP: (!) 161/80  Jones: (!) 50    General Examination: The patient is a very pleasant [age]o. male in no acute distress. He appears well-developed and well-nourished and very well groomed.   HEENT: Normocephalic, atraumatic, pupils are equal, round and reactive to light and accommodation. He has mild difficulty tracking. Airway examination reveals no significant mouth dryness. Otherwise stable and benign findings, hearing is grossly intact. Speech is not dysarthric, scant.  Chest: Clear to auscultation without wheezing, rhonchi or crackles noted.  Heart: S1+S2+0, regular and stable 4/6 systolic murmur.   Abdomen: Soft, non-tender and non-distended with normal bowel sounds appreciated on auscultation.  Extremities: There is no pitting edema in the distal lower extremities bilaterally.   Skin: Warm and dry without trophic changes noted.   Musculoskeletal: exam reveals no obvious joint deformities, tenderness or joint swelling or erythema.   Neurologically:   Mental status: The patient is awake, alert and pays good attention. Minimally verbal, but answering questions appropriately. Mood is normal, affect is normal.   In 2014: MMSE is 22/30, CDT 3/4, AFT is 6.  On 02/22/2014: MMSE is 23/30, AFT is 10, clock drawing is 2/4.  On 08/26/2014: MMSE 19/30, CDT: 2/4, AFT: 9/min.  On 12/29/2014: MMSE: 22/30, CDT: 2/4, AFT: 7/min.   On 06/20/2015:  MMSE: 20/30, CDT: 2/4, AFT: 6/min.   On 12/19/2015: MMSE: 20/30, CDT: 2/4, AFT: 7/min.  On 06/20/2016: MMSE: 16/30, CDT 1/4, AFT: 5/min.  On 10/14/2017: MMSE: 15/30, CDT: 1/4, AFT: 7/min.   Cranial nerves II - XII are as described above under HEENT exam. In addition: shoulder shrug is normal with equal shoulder height noted. Motor exam: Normal bulk, strength for age and normal tone. He has no tremor. Romberg is not testable safely. Fine motor skills are globally mildly impaired, but stable.  Sensory exam: intact to light touch in the upper and lower extremities.  Gait, station and balance: He stands with mild difficulty and has to push himself up but requires no assistance. His posture is fairly age-appropriate and he walks slowly and cautiously with his cane. Tandem walk is not possible safely. He turns in 3 steps.   Assessment and Plan:   In summary, Spencer Jones a very pleasant [age]ar old male with an underlying medical history of atrial fibrillation, coronary artery disease, status post bypass, hypertension, hyperlipidemia, prostate cancer, renal insufficiency, gout, pacemaker placement for sick sinus disease and dementia, who presents for followup consultation of his Alzheimer's disease, associated with behavioral problems (in the past), but no recent behavioral concerns, thankfully. His memory scores have declined over time, but slowly in the last few years, and he continues  to be on long-acting Namenda 28 mg once daily, and Razadyne ER 8 mg. We tried to increase the latter to 16 mg, but he could not tolerate it and had side effects including lethargy and more gait difficulty and balance problems. Therefore, we decreased it back to 8 mg. He continues to take low-dose Seroquel at night which has helped him with nighttime agitation and sleep, he has been able to tolerate this for the past 4+ years and has had no side effects. We will continue at this time with his current medication  regimen. We mutually agreed to follow him less frequently for his memory as he has done reasonably well on the current regimen and there are no major new issues lately. I suggested a 12 month follow-up, sooner if needed I answered all their questions today and the patient and his 2 children were in agreement.  I spent 20 minutes in total face-to-face time with the patient, more than 50% of which was spent in counseling and coordination of care, reviewing test results, reviewing medication and discussing or reviewing the diagnosis of dementia, its prognosis and treatment options. Pertinent laboratory and imaging test results that were available during this visit with the patient were reviewed by me and considered in my medical decision making (see chart for details).

## 2017-10-14 NOTE — Patient Instructions (Signed)
We will keep your memory medication the same.  I will see you routinely in one year, sooner if needed.

## 2017-12-29 ENCOUNTER — Telehealth: Payer: Self-pay | Admitting: Neurology

## 2017-12-29 NOTE — Telephone Encounter (Signed)
I spoke with Dr. Rexene Alberts. From her end, pt is ok to stop seroquel, but pt's nighttime agitation and sleep may change if pt does stop seroquel. Pt should not need to taper off of seroquel due to low dose.  I called pt's daughter, Spencer Jones, per DPR, and advised her of this information. She will speak with pt's facility.

## 2017-12-29 NOTE — Telephone Encounter (Signed)
Pt's daughter called said the facility where he is called her today wanting to d/c QUEtiapine (SEROQUEL) 25 MG tablet. She said the Dr at the facility said the pt is on a lot of medications and thought bc the pt is on zoloft and razadyne maybe the seroquel could be d/c. Please call to advise

## 2018-08-28 ENCOUNTER — Other Ambulatory Visit: Payer: Self-pay

## 2018-08-28 ENCOUNTER — Emergency Department (HOSPITAL_BASED_OUTPATIENT_CLINIC_OR_DEPARTMENT_OTHER): Payer: Medicare Other

## 2018-08-28 ENCOUNTER — Emergency Department (HOSPITAL_BASED_OUTPATIENT_CLINIC_OR_DEPARTMENT_OTHER)
Admission: EM | Admit: 2018-08-28 | Discharge: 2018-08-28 | Disposition: A | Discharge status: Home / Self Care | Payer: Medicare Other | Attending: Emergency Medicine | Admitting: Emergency Medicine

## 2018-08-28 ENCOUNTER — Encounter (HOSPITAL_BASED_OUTPATIENT_CLINIC_OR_DEPARTMENT_OTHER): Payer: Self-pay

## 2018-08-28 DIAGNOSIS — Z79899 Other long term (current) drug therapy: Secondary | ICD-10-CM | POA: Insufficient documentation

## 2018-08-28 DIAGNOSIS — R059 Cough, unspecified: Secondary | ICD-10-CM

## 2018-08-28 DIAGNOSIS — Y92129 Unspecified place in nursing home as the place of occurrence of the external cause: Secondary | ICD-10-CM | POA: Diagnosis not present

## 2018-08-28 DIAGNOSIS — S0012XA Contusion of left eyelid and periocular area, initial encounter: Secondary | ICD-10-CM | POA: Diagnosis present

## 2018-08-28 DIAGNOSIS — I5032 Chronic diastolic (congestive) heart failure: Secondary | ICD-10-CM | POA: Diagnosis not present

## 2018-08-28 DIAGNOSIS — G309 Alzheimer's disease, unspecified: Secondary | ICD-10-CM | POA: Insufficient documentation

## 2018-08-28 DIAGNOSIS — R05 Cough: Secondary | ICD-10-CM

## 2018-08-28 DIAGNOSIS — R062 Wheezing: Secondary | ICD-10-CM | POA: Diagnosis not present

## 2018-08-28 DIAGNOSIS — T148XXA Other injury of unspecified body region, initial encounter: Secondary | ICD-10-CM

## 2018-08-28 DIAGNOSIS — W19XXXA Unspecified fall, initial encounter: Secondary | ICD-10-CM | POA: Insufficient documentation

## 2018-08-28 DIAGNOSIS — Y939 Activity, unspecified: Secondary | ICD-10-CM | POA: Diagnosis not present

## 2018-08-28 DIAGNOSIS — Z8546 Personal history of malignant neoplasm of prostate: Secondary | ICD-10-CM | POA: Diagnosis not present

## 2018-08-28 DIAGNOSIS — I110 Hypertensive heart disease with heart failure: Secondary | ICD-10-CM | POA: Insufficient documentation

## 2018-08-28 DIAGNOSIS — Z87891 Personal history of nicotine dependence: Secondary | ICD-10-CM | POA: Insufficient documentation

## 2018-08-28 DIAGNOSIS — Y999 Unspecified external cause status: Secondary | ICD-10-CM | POA: Diagnosis not present

## 2018-08-28 DIAGNOSIS — F0281 Dementia in other diseases classified elsewhere with behavioral disturbance: Secondary | ICD-10-CM | POA: Diagnosis not present

## 2018-08-28 DIAGNOSIS — Z7901 Long term (current) use of anticoagulants: Secondary | ICD-10-CM | POA: Insufficient documentation

## 2018-08-28 DIAGNOSIS — Z950 Presence of cardiac pacemaker: Secondary | ICD-10-CM | POA: Insufficient documentation

## 2018-08-28 HISTORY — DX: Polyneuropathy, unspecified: G62.9

## 2018-08-28 LAB — CBC WITH DIFFERENTIAL/PLATELET
Abs Immature Granulocytes: 0.04 10*3/uL (ref 0.00–0.07)
Basophils Absolute: 0 10*3/uL (ref 0.0–0.1)
Basophils Relative: 0 %
EOS PCT: 4 %
Eosinophils Absolute: 0.3 10*3/uL (ref 0.0–0.5)
HEMATOCRIT: 36.2 % — AB (ref 39.0–52.0)
Hemoglobin: 11.4 g/dL — ABNORMAL LOW (ref 13.0–17.0)
Immature Granulocytes: 1 %
LYMPHS ABS: 0.8 10*3/uL (ref 0.7–4.0)
Lymphocytes Relative: 11 %
MCH: 29.8 pg (ref 26.0–34.0)
MCHC: 31.5 g/dL (ref 30.0–36.0)
MCV: 94.8 fL (ref 80.0–100.0)
Monocytes Absolute: 0.6 10*3/uL (ref 0.1–1.0)
Monocytes Relative: 9 %
Neutro Abs: 5.4 10*3/uL (ref 1.7–7.7)
Neutrophils Relative %: 75 %
Platelets: 106 10*3/uL — ABNORMAL LOW (ref 150–400)
RBC: 3.82 MIL/uL — ABNORMAL LOW (ref 4.22–5.81)
RDW: 16.7 % — ABNORMAL HIGH (ref 11.5–15.5)
WBC: 7.1 10*3/uL (ref 4.0–10.5)
nRBC: 0 % (ref 0.0–0.2)

## 2018-08-28 LAB — COMPREHENSIVE METABOLIC PANEL
ALBUMIN: 3.8 g/dL (ref 3.5–5.0)
ALT: 20 U/L (ref 0–44)
AST: 29 U/L (ref 15–41)
Alkaline Phosphatase: 106 U/L (ref 38–126)
Anion gap: 8 (ref 5–15)
BILIRUBIN TOTAL: 1.3 mg/dL — AB (ref 0.3–1.2)
BUN: 22 mg/dL (ref 8–23)
CO2: 24 mmol/L (ref 22–32)
Calcium: 8.7 mg/dL — ABNORMAL LOW (ref 8.9–10.3)
Chloride: 107 mmol/L (ref 98–111)
Creatinine, Ser: 1.29 mg/dL — ABNORMAL HIGH (ref 0.61–1.24)
GFR calc Af Amer: 56 mL/min — ABNORMAL LOW (ref >60)
GFR calc non Af Amer: 48 mL/min — ABNORMAL LOW (ref >60)
Glucose, Bld: 87 mg/dL (ref 70–99)
Potassium: 4.1 mmol/L (ref 3.5–5.1)
Sodium: 139 mmol/L (ref 135–145)
Total Protein: 7 g/dL (ref 6.5–8.1)

## 2018-08-28 LAB — APTT: aPTT: 55 seconds — ABNORMAL HIGH (ref 24–36)

## 2018-08-28 LAB — URINALYSIS, ROUTINE W REFLEX MICROSCOPIC
Bilirubin Urine: NEGATIVE
Glucose, UA: NEGATIVE mg/dL
Hgb urine dipstick: NEGATIVE
Ketones, ur: NEGATIVE mg/dL
Leukocytes,Ua: NEGATIVE
Nitrite: NEGATIVE
Protein, ur: NEGATIVE mg/dL
Specific Gravity, Urine: 1.01 (ref 1.005–1.030)
pH: 6 (ref 5.0–8.0)

## 2018-08-28 LAB — PROTIME-INR
INR: 2.36
Prothrombin Time: 25.5 seconds — ABNORMAL HIGH (ref 11.4–15.2)

## 2018-08-28 MED ORDER — ALBUTEROL SULFATE (5 MG/ML) 0.5% IN NEBU: 2.5000 mg | INHALATION_SOLUTION | Freq: Four times a day (QID) | RESPIRATORY_TRACT | 12 refills | Status: AC | PRN

## 2018-08-28 MED ORDER — IOPAMIDOL (ISOVUE-300) INJECTION 61%
100.0000 mL | Freq: Once | INTRAVENOUS | Status: AC | PRN
  Administered 2018-08-28: 80 mL via INTRAVENOUS

## 2018-08-28 MED ORDER — ALBUTEROL SULFATE (2.5 MG/3ML) 0.083% IN NEBU
2.5000 mg | INHALATION_SOLUTION | Freq: Once | RESPIRATORY_TRACT | Status: AC
  Administered 2018-08-28: 2.5 mg via RESPIRATORY_TRACT
  Filled 2018-08-28: qty 3

## 2018-08-28 NOTE — Discharge Instructions (Addendum)
Evaluated today after fall.  His head CT was negative for acute bleeding or fracture. Lab work at baseline. Mild ascites in abdomen. I have prescribed a nebulizer.  Take Tylenol as needed for pain. Use ice over hematoma for the next 24 hours then heat.  They may start to develop on his lower face as the hematoma starts to fade.  Follow-up with PCP for reevaluation.  Return to the ED for any worsening symptoms.

## 2018-08-28 NOTE — ED Triage Notes (Signed)
Per son pt fell at [facility] this am-note from [place] with pt does not read time of fall-pt being treated last week for URI with cont's wheezing-rx abx-also concerns of abd distention and has increased lasix-pt to triage in w/c-states he does not know why he is here-left eye with swelling/closed-pt NAD

## 2018-08-28 NOTE — ED Provider Notes (Signed)
[facility] EMERGENCY DEPARTMENT Provider Note   CSN: [id number] Arrival date & time: 08/28/18  1230   History   Chief Complaint Chief Complaint  Patient presents with  . Fall    HPI Spencer Jones is a 85 y.o. male with past medical history significant for Alzheimer's, atrial fibrillation, on anticoagulation, CAD, hypertension, hyperlipidemia, CHF with permanent pacemaker who presents for evaluation after fall.  Per patient's son, patient resident at [facility].  Staff states patient walked out of his room this morning they noticed a large hematoma to his left eye and forehead.  Unsure of situation surrounding fall as patient does not remember.  Per patient son patient is also had continued wheeze after being treated for an upper respiratory infection last week.  Percent patient given antibiotics, Mucinex however unsure of inhalers.  Son also concerned of progressive abdominal distention over the last 6 months.  Per son, family does not want surgical intervention or invasive measures for patient however would like imaging. DNR paperwork at bedside.  Level 5 caveat, demented at baseline.  HPI  Past Medical History:  Diagnosis Date  . Alzheimer disease ([place])   . Atrial fibrillation ([place])    permanent  . Coronary atherosclerosis of artery bypass graft 1991  . Dementia with behavioral disturbance ([place])   . Gout   . Hyperlipidemia   . Hyperlipidemia   . Hypertension   . Polyneuropathy   . Prostate cancer ([place])   . Renal insufficiency   . Tachycardia-bradycardia [facility])    s/p PPM by Dr Olevia Perches    Patient Active Problem List   Diagnosis Date Noted  . Chronic diastolic CHF (congestive heart failure) ([place]) 01/07/2015  . Aortic stenosis 01/07/2015  . Alzheimer disease ([place])   . Dementia with behavioral disturbance ([place])   . Alzheimer's disease ([place]) 09/11/2012  . Unspecified hereditary and idiopathic peripheral neuropathy 09/11/2012  . Cardiac pacemaker in situ  09/11/2012  . Congestive heart failure, unspecified 09/11/2012  . Sebaceous cyst, left upper chest. 07/28/2012  . Hyperlipidemia 04/17/2011  . MEMORY LOSS 11/01/2009  . Tachycardia-bradycardia syndrome ([place]) 02/11/2009  . RECTAL BLEEDING 02/01/2009  . CONTUSION OF BUTTOCK 05/25/2008  . AODM 05/26/2007  . OTHER AND UNSPECIFIED HYPERLIPIDEMIA 05/26/2007  . GOUT 04/28/2007  . HYPERTENSION 04/28/2007  . Coronary atherosclerosis 04/28/2007  . ATRIAL FIBRILLATION 04/28/2007  . PROSTATE CANCER, HX OF 04/28/2007    Past Surgical History:  Procedure Laterality Date  . COLONOSCOPY    . CORONARY ARTERY BYPASS GRAFT  1991  . INGUINAL HERNIA REPAIR    . PACEMAKER INSERTION     SJM implanted by Dr Olevia Perches, most recent generator 2007  . sebaceous cyst excision   2005, 2008  . SPERMATOCELECTOMY    . TONSILLECTOMY          Home Medications    Prior to Admission medications   Medication Sig Start Date End Date Taking? Authorizing Provider  Melatonin 2.5 MG CAPS Take 2 capsules by mouth.   Yes [provider]  albuterol (PROVENTIL) (5 MG/ML) 0.5% nebulizer solution Take 0.5 mLs (2.5 mg total) by nebulization every 6 (six) hours as needed for wheezing or shortness of breath. 08/28/18   Alfredo Collymore A, PA-C  allopurinol (ZYLOPRIM) 300 MG tablet Take 300 mg by mouth daily.  08/17/14   [provider]  amLODipine (NORVASC) 5 MG tablet Take 5 mg by mouth daily.    [provider]  benazepril (LOTENSIN) 20 MG tablet Take by mouth daily.  06/15/14   [provider]  Cholecalciferol (VITAMIN D3) 2000 UNITS TABS Take 2,000 Units by mouth daily.    [provider]  COLCHICINE PO Take 0.3 mg by mouth daily.     [provider]  doxazosin (CARDURA) 2 MG tablet Take 2 mg by mouth daily.    [provider]  furosemide (LASIX) 20 MG tablet Take 20 mg by mouth daily.  01/29/15   [provider]  galantamine (RAZADYNE ER) 8 MG 24 hr  capsule Take 1 capsule (8 mg total) by mouth daily with breakfast. 09/20/16   Star Age, MD  memantine (NAMENDA XR) 28 MG CP24 24 hr capsule Take 1 capsule (28 mg total) by mouth daily. 06/20/16   Star Age, MD  potassium chloride (KLOR-CON 10) 10 MEQ tablet Take 1 tablet (10 mEq total) by mouth daily. 01/05/15   Larey Dresser, MD  QUEtiapine (SEROQUEL) 25 MG tablet Take 1/2 tablet 12.5 mg by mouth daily at bedtime 02/05/15   [provider]  sertraline (ZOLOFT) 25 MG tablet Take 1 tablet by mouth every other day. Alternate between 50mg  12/30/14   [provider]  sertraline (ZOLOFT) 50 MG tablet Take 50 mg by mouth every other day. Alternate between 25mg     [provider]  warfarin (COUMADIN) 3 MG tablet Take 4 mg by mouth. Tuesday, Thursday, Saturday, Sunday 08/22/14   [provider]  warfarin (COUMADIN) 3 MG tablet Take 4.5 mg by mouth daily. Pt takes 3.5mg  by mouth Monday, Wednesday, and Friday.     [provider]    Family History Family History  Problem Relation Age of Onset  . Alzheimer's disease Mother   . Heart failure Father     Social History Social History   Tobacco Use  . Smoking status: Former Research scientist (life sciences)  . Smokeless tobacco: Never Used  Substance Use Topics  . Alcohol use: No  . Drug use: No     Allergies   Amoxicillin; Penicillins; Sulfamethoxazole; and Tetanus toxoid adsorbed   Review of Systems Review of Systems  Unable to perform ROS: Dementia     Physical Exam Updated Vital Signs BP (!) 155/87 (BP Location: Right Arm)   Pulse (!) 54   Temp 97.9 F (36.6 C) (Oral)   Resp 16   Ht 5\' 11"  (1.803 m)   Wt 93.9 kg   SpO2 100%   BMI 28.87 kg/m   Physical Exam Vitals signs and nursing note reviewed.  Constitutional:      General: He is not in acute distress.    Appearance: He is well-developed. He is not ill-appearing, toxic-appearing or diaphoretic.  HENT:     Head:     Comments: Large hematoma  surrounding left orbit which extends into the left forehead. No tenderness to facial bones. No crepitus.    Nose:     Comments: No tenderness palpation over nasal bridge.    Mouth/Throat:     Comments: Posterior oropharynx clear.  Mucous membranes moist. Eyes:     Extraocular Movements:     Right eye: Normal extraocular motion and no nystagmus.     Conjunctiva/sclera:     Right eye: Right conjunctiva is not injected. No chemosis, exudate or hemorrhage.    Pupils: Pupils are equal, round, and reactive to light.     Right eye: Pupil is not sluggish.     Visual Fields: Right eye visual fields normal.     Comments: Unable to completely open left eye secondary  to large hematoma surrounding left orbit. Vision grossly intact to right eye.  Decreased vision to right eye secondary to known cataracts.  Able to visualize inferior portion of left conjunctiva, No obvious hemorrhage to lower portion of conjunctiva. EOM to left eye without difficulty or pain.  Unable to visualize pupils left eye secondary to swelling.  Neck:     Musculoskeletal: Normal range of motion and neck supple.     Comments: No neck stiffness or neck rigidity.  Phonation normal.  No cervical lymphadenopathy. Cardiovascular:     Rate and Rhythm: Regular rhythm. Bradycardia present.     Comments: Murmur present, history of murmur. Pulmonary:     Effort: Pulmonary effort is normal. No respiratory distress.     Comments: Mild expiratory wheezing to bilateral lungs.  No tachypnea.  No accessory muscle usage.  Able to speak in full sentences without difficulty. Abdominal:     General: There is no distension.     Palpations: Abdomen is soft.     Comments: Abdomen distended.  No tenderness to palpation.  No rebound or guarding.  Musculoskeletal: Normal range of motion.     Comments: Moves all 4 extremities without difficulty.  Ambulatory in department without difficulty.  No midline spinal tenderness palpation.  No tenderness to  bilateral hips or pelvis.  Patient able to flex, extend at hips as well as lower extremities without difficulty.  Skin:    General: Skin is warm and dry.     Comments: Lower extremity venous stasis skin changes.  No edema, erythema or warmth.  Neurological:     Mental Status: He is alert.     Comments: Intact sensation to bilateral lower and upper extremities without difficulty.      ED Treatments / Results  Labs (all labs ordered are listed, but only abnormal results are displayed) Labs Reviewed  CBC WITH DIFFERENTIAL/PLATELET - Abnormal; Notable for the following components:      Result Value   RBC 3.82 (*)    Hemoglobin 11.4 (*)    HCT 36.2 (*)    RDW 16.7 (*)    Platelets 106 (*)    All other components within normal limits  COMPREHENSIVE METABOLIC PANEL - Abnormal; Notable for the following components:   Creatinine, Ser 1.29 (*)    Calcium 8.7 (*)    Total Bilirubin 1.3 (*)    GFR calc non Af Amer 48 (*)    GFR calc Af Amer 56 (*)    All other components within normal limits  APTT - Abnormal; Notable for the following components:   aPTT 55 (*)    All other components within normal limits  PROTIME-INR - Abnormal; Notable for the following components:   Prothrombin Time 25.5 (*)    All other components within normal limits  URINE CULTURE  URINALYSIS, ROUTINE W REFLEX MICROSCOPIC    EKG EKG Interpretation  Date/Time:  Friday August 28 2018 15:38:14 EST Ventricular Rate:  57 PR Interval:    QRS Duration: 118 QT Interval:  515 QTC Calculation: 502 R Axis:   -38 Text Interpretation:  Atrial fibrillation Nonspecific IVCD with LAD Borderline low voltage, extremity leads Nonspecific T abnrm, anterolateral leads Otherwise no significant change Confirmed by Deno Etienne [phone]) on 08/28/2018 4:57:36 PM   Radiology Dg Chest 2 View  Result Date: 08/28/2018 CLINICAL DATA:  Sick with the flu. Persistent cough. EXAM: CHEST - 2 VIEW COMPARISON:  11/04/2017 FINDINGS: Stable  mild cardiomegaly with tortuous atherosclerotic aorta. The patient is status  post CABG. Right-sided pacemaker apparatus with leads in the right atrium and right ventricle are redemonstrated. Mild interstitial edema is noted with minimal blunting the posterior costophrenic angles that may reflect tiny pleural effusions. No pneumothorax. No acute osseous abnormality. IMPRESSION: Cardiomegaly with mild interstitial edema. No acute pulmonary consolidation. Probable tiny posterior pleural effusions. Electronically Signed   By: Ashley Royalty M.D.   On: 08/28/2018 15:45   Ct Head Wo Contrast  Result Date: 08/28/2018 CLINICAL DATA:  Fall, large hematoma to the left eye EXAM: CT HEAD WITHOUT CONTRAST CT MAXILLOFACIAL WITHOUT CONTRAST CT CERVICAL SPINE WITHOUT CONTRAST CT ABDOMEN AND PELVIS WITHOUT CONTRAST TECHNIQUE: Contiguous axial images were obtained from the base of the skull through the vertex without intravenous contrast. Multidetector CT imaging of the maxillofacial structures was performed. Multiplanar CT image reconstructions were also generated. A small metallic BB was placed on the right temple in order to reliably differentiate right from left. Multidetector CT imaging of the cervical spine was performed without intravenous contrast. Multiplanar CT image reconstructions were also generated. Multidetector CT imaging of the abdomen and pelvis was performed following the standard protocol without IV contrast. COMPARISON:  CT brain and cervical spine, 10/27/2017 FINDINGS: CT HEAD FINDINGS Brain: No evidence of acute infarction, hemorrhage, hydrocephalus, extra-axial collection or mass lesion/mass effect. Periventricular white matter hypodensity. Vascular: No hyperdense vessel or unexpected calcification. CT FACIAL BONES FINDINGS Skull: Normal. Negative for fracture or focal lesion. Facial bones: No displaced fractures or dislocations. Sinuses/Orbits: No acute finding. Other: Large soft tissue hematoma overlying  the left forehead and orbit. CT CERVICAL SPINE FINDINGS Alignment: Degenerative straightening and reversal of the normal cervical lordosis. Skull base and vertebrae: No acute fracture. No primary bone lesion or focal pathologic process. Soft tissues and spinal canal: No prevertebral fluid or swelling. No visible canal hematoma. Disc levels: Severe multilevel disc degenerative disease and osteophytosis. Ankylosis of C3-C4. Upper chest: Right pleural effusion. Other: None. Lower chest: Right pleural effusion. Cardiomegaly. Coronary artery calcifications. Hepatobiliary: No focal liver abnormality is seen. Coarse hepatic contour. Small gallstone in the dependent gallbladder. No gallbladder wall thickening, or biliary dilatation. Pancreas: Unremarkable. No pancreatic ductal dilatation or surrounding inflammatory changes. Spleen: Normal in size without focal abnormality. Adrenals/Urinary Tract: Adrenal glands are unremarkable. Small nonobstructive calculus of the superior pole of the left kidney. Kidneys are otherwise normal, no focal lesion, or hydronephrosis. Bladder is unremarkable. Stomach/Bowel: Stomach is within normal limits. Appendix appears normal. No evidence of bowel wall thickening, distention, or inflammatory changes. Sigmoid diverticulosis. Vascular/Lymphatic: Calcific atherosclerosis. No enlarged abdominal or pelvic lymph nodes. Reproductive: No mass or other abnormality. Other: No abdominal wall hernia or abnormality. Small volume 4 quadrant ascites. Musculoskeletal: No acute or significant osseous findings. IMPRESSION: 1. No acute intracranial pathology. Small-vessel white matter disease. 2. No displaced fracture or dislocation of the facial bones. Large soft tissue hematoma overlying the left forehead and orbit. 3. No fracture or static subluxation of the cervical spine. Degenerative straightening and reversal of the normal cervical lordosis with severe multilevel disc degenerative disease and  osteophytosis. 4. Right pleural effusion, partially imaged and without displaced rib fracture or other causative etiology on included portions of the upper and lower chest. 5. No definite evidence of acute traumatic injury to the abdomen or pelvis. 6. Small volume fluid attenuation ascites without specific etiology identified by CT. The hepatic contour is coarse without overt stigmata of cirrhosis. Correlate for evidence of chronic liver disease. 7.  Other chronic and incidental findings as detailed above. Electronically  Signed   By: Eddie Candle M.D.   On: 08/28/2018 15:40   Ct Cervical Spine Wo Contrast  Result Date: 08/28/2018 CLINICAL DATA:  Fall, large hematoma to the left eye EXAM: CT HEAD WITHOUT CONTRAST CT MAXILLOFACIAL WITHOUT CONTRAST CT CERVICAL SPINE WITHOUT CONTRAST CT ABDOMEN AND PELVIS WITHOUT CONTRAST TECHNIQUE: Contiguous axial images were obtained from the base of the skull through the vertex without intravenous contrast. Multidetector CT imaging of the maxillofacial structures was performed. Multiplanar CT image reconstructions were also generated. A small metallic BB was placed on the right temple in order to reliably differentiate right from left. Multidetector CT imaging of the cervical spine was performed without intravenous contrast. Multiplanar CT image reconstructions were also generated. Multidetector CT imaging of the abdomen and pelvis was performed following the standard protocol without IV contrast. COMPARISON:  CT brain and cervical spine, 10/27/2017 FINDINGS: CT HEAD FINDINGS Brain: No evidence of acute infarction, hemorrhage, hydrocephalus, extra-axial collection or mass lesion/mass effect. Periventricular white matter hypodensity. Vascular: No hyperdense vessel or unexpected calcification. CT FACIAL BONES FINDINGS Skull: Normal. Negative for fracture or focal lesion. Facial bones: No displaced fractures or dislocations. Sinuses/Orbits: No acute finding. Other: Large soft  tissue hematoma overlying the left forehead and orbit. CT CERVICAL SPINE FINDINGS Alignment: Degenerative straightening and reversal of the normal cervical lordosis. Skull base and vertebrae: No acute fracture. No primary bone lesion or focal pathologic process. Soft tissues and spinal canal: No prevertebral fluid or swelling. No visible canal hematoma. Disc levels: Severe multilevel disc degenerative disease and osteophytosis. Ankylosis of C3-C4. Upper chest: Right pleural effusion. Other: None. Lower chest: Right pleural effusion. Cardiomegaly. Coronary artery calcifications. Hepatobiliary: No focal liver abnormality is seen. Coarse hepatic contour. Small gallstone in the dependent gallbladder. No gallbladder wall thickening, or biliary dilatation. Pancreas: Unremarkable. No pancreatic ductal dilatation or surrounding inflammatory changes. Spleen: Normal in size without focal abnormality. Adrenals/Urinary Tract: Adrenal glands are unremarkable. Small nonobstructive calculus of the superior pole of the left kidney. Kidneys are otherwise normal, no focal lesion, or hydronephrosis. Bladder is unremarkable. Stomach/Bowel: Stomach is within normal limits. Appendix appears normal. No evidence of bowel wall thickening, distention, or inflammatory changes. Sigmoid diverticulosis. Vascular/Lymphatic: Calcific atherosclerosis. No enlarged abdominal or pelvic lymph nodes. Reproductive: No mass or other abnormality. Other: No abdominal wall hernia or abnormality. Small volume 4 quadrant ascites. Musculoskeletal: No acute or significant osseous findings. IMPRESSION: 1. No acute intracranial pathology. Small-vessel white matter disease. 2. No displaced fracture or dislocation of the facial bones. Large soft tissue hematoma overlying the left forehead and orbit. 3. No fracture or static subluxation of the cervical spine. Degenerative straightening and reversal of the normal cervical lordosis with severe multilevel disc  degenerative disease and osteophytosis. 4. Right pleural effusion, partially imaged and without displaced rib fracture or other causative etiology on included portions of the upper and lower chest. 5. No definite evidence of acute traumatic injury to the abdomen or pelvis. 6. Small volume fluid attenuation ascites without specific etiology identified by CT. The hepatic contour is coarse without overt stigmata of cirrhosis. Correlate for evidence of chronic liver disease. 7.  Other chronic and incidental findings as detailed above. Electronically Signed   By: Eddie Candle M.D.   On: 08/28/2018 15:40   Ct Abdomen Pelvis W Contrast  Result Date: 08/28/2018 CLINICAL DATA:  Fall, large hematoma to the left eye EXAM: CT HEAD WITHOUT CONTRAST CT MAXILLOFACIAL WITHOUT CONTRAST CT CERVICAL SPINE WITHOUT CONTRAST CT ABDOMEN AND PELVIS WITHOUT CONTRAST TECHNIQUE:  Contiguous axial images were obtained from the base of the skull through the vertex without intravenous contrast. Multidetector CT imaging of the maxillofacial structures was performed. Multiplanar CT image reconstructions were also generated. A small metallic BB was placed on the right temple in order to reliably differentiate right from left. Multidetector CT imaging of the cervical spine was performed without intravenous contrast. Multiplanar CT image reconstructions were also generated. Multidetector CT imaging of the abdomen and pelvis was performed following the standard protocol without IV contrast. COMPARISON:  CT brain and cervical spine, 10/27/2017 FINDINGS: CT HEAD FINDINGS Brain: No evidence of acute infarction, hemorrhage, hydrocephalus, extra-axial collection or mass lesion/mass effect. Periventricular white matter hypodensity. Vascular: No hyperdense vessel or unexpected calcification. CT FACIAL BONES FINDINGS Skull: Normal. Negative for fracture or focal lesion. Facial bones: No displaced fractures or dislocations. Sinuses/Orbits: No acute finding.  Other: Large soft tissue hematoma overlying the left forehead and orbit. CT CERVICAL SPINE FINDINGS Alignment: Degenerative straightening and reversal of the normal cervical lordosis. Skull base and vertebrae: No acute fracture. No primary bone lesion or focal pathologic process. Soft tissues and spinal canal: No prevertebral fluid or swelling. No visible canal hematoma. Disc levels: Severe multilevel disc degenerative disease and osteophytosis. Ankylosis of C3-C4. Upper chest: Right pleural effusion. Other: None. Lower chest: Right pleural effusion. Cardiomegaly. Coronary artery calcifications. Hepatobiliary: No focal liver abnormality is seen. Coarse hepatic contour. Small gallstone in the dependent gallbladder. No gallbladder wall thickening, or biliary dilatation. Pancreas: Unremarkable. No pancreatic ductal dilatation or surrounding inflammatory changes. Spleen: Normal in size without focal abnormality. Adrenals/Urinary Tract: Adrenal glands are unremarkable. Small nonobstructive calculus of the superior pole of the left kidney. Kidneys are otherwise normal, no focal lesion, or hydronephrosis. Bladder is unremarkable. Stomach/Bowel: Stomach is within normal limits. Appendix appears normal. No evidence of bowel wall thickening, distention, or inflammatory changes. Sigmoid diverticulosis. Vascular/Lymphatic: Calcific atherosclerosis. No enlarged abdominal or pelvic lymph nodes. Reproductive: No mass or other abnormality. Other: No abdominal wall hernia or abnormality. Small volume 4 quadrant ascites. Musculoskeletal: No acute or significant osseous findings. IMPRESSION: 1. No acute intracranial pathology. Small-vessel white matter disease. 2. No displaced fracture or dislocation of the facial bones. Large soft tissue hematoma overlying the left forehead and orbit. 3. No fracture or static subluxation of the cervical spine. Degenerative straightening and reversal of the normal cervical lordosis with severe  multilevel disc degenerative disease and osteophytosis. 4. Right pleural effusion, partially imaged and without displaced rib fracture or other causative etiology on included portions of the upper and lower chest. 5. No definite evidence of acute traumatic injury to the abdomen or pelvis. 6. Small volume fluid attenuation ascites without specific etiology identified by CT. The hepatic contour is coarse without overt stigmata of cirrhosis. Correlate for evidence of chronic liver disease. 7.  Other chronic and incidental findings as detailed above. Electronically Signed   By: Eddie Candle M.D.   On: 08/28/2018 15:40   Ct Maxillofacial Wo Contrast  Result Date: 08/28/2018 CLINICAL DATA:  Fall, large hematoma to the left eye EXAM: CT HEAD WITHOUT CONTRAST CT MAXILLOFACIAL WITHOUT CONTRAST CT CERVICAL SPINE WITHOUT CONTRAST CT ABDOMEN AND PELVIS WITHOUT CONTRAST TECHNIQUE: Contiguous axial images were obtained from the base of the skull through the vertex without intravenous contrast. Multidetector CT imaging of the maxillofacial structures was performed. Multiplanar CT image reconstructions were also generated. A small metallic BB was placed on the right temple in order to reliably differentiate right from left. Multidetector CT imaging of the  cervical spine was performed without intravenous contrast. Multiplanar CT image reconstructions were also generated. Multidetector CT imaging of the abdomen and pelvis was performed following the standard protocol without IV contrast. COMPARISON:  CT brain and cervical spine, 10/27/2017 FINDINGS: CT HEAD FINDINGS Brain: No evidence of acute infarction, hemorrhage, hydrocephalus, extra-axial collection or mass lesion/mass effect. Periventricular white matter hypodensity. Vascular: No hyperdense vessel or unexpected calcification. CT FACIAL BONES FINDINGS Skull: Normal. Negative for fracture or focal lesion. Facial bones: No displaced fractures or dislocations. Sinuses/Orbits:  No acute finding. Other: Large soft tissue hematoma overlying the left forehead and orbit. CT CERVICAL SPINE FINDINGS Alignment: Degenerative straightening and reversal of the normal cervical lordosis. Skull base and vertebrae: No acute fracture. No primary bone lesion or focal pathologic process. Soft tissues and spinal canal: No prevertebral fluid or swelling. No visible canal hematoma. Disc levels: Severe multilevel disc degenerative disease and osteophytosis. Ankylosis of C3-C4. Upper chest: Right pleural effusion. Other: None. Lower chest: Right pleural effusion. Cardiomegaly. Coronary artery calcifications. Hepatobiliary: No focal liver abnormality is seen. Coarse hepatic contour. Small gallstone in the dependent gallbladder. No gallbladder wall thickening, or biliary dilatation. Pancreas: Unremarkable. No pancreatic ductal dilatation or surrounding inflammatory changes. Spleen: Normal in size without focal abnormality. Adrenals/Urinary Tract: Adrenal glands are unremarkable. Small nonobstructive calculus of the superior pole of the left kidney. Kidneys are otherwise normal, no focal lesion, or hydronephrosis. Bladder is unremarkable. Stomach/Bowel: Stomach is within normal limits. Appendix appears normal. No evidence of bowel wall thickening, distention, or inflammatory changes. Sigmoid diverticulosis. Vascular/Lymphatic: Calcific atherosclerosis. No enlarged abdominal or pelvic lymph nodes. Reproductive: No mass or other abnormality. Other: No abdominal wall hernia or abnormality. Small volume 4 quadrant ascites. Musculoskeletal: No acute or significant osseous findings. IMPRESSION: 1. No acute intracranial pathology. Small-vessel white matter disease. 2. No displaced fracture or dislocation of the facial bones. Large soft tissue hematoma overlying the left forehead and orbit. 3. No fracture or static subluxation of the cervical spine. Degenerative straightening and reversal of the normal cervical lordosis  with severe multilevel disc degenerative disease and osteophytosis. 4. Right pleural effusion, partially imaged and without displaced rib fracture or other causative etiology on included portions of the upper and lower chest. 5. No definite evidence of acute traumatic injury to the abdomen or pelvis. 6. Small volume fluid attenuation ascites without specific etiology identified by CT. The hepatic contour is coarse without overt stigmata of cirrhosis. Correlate for evidence of chronic liver disease. 7.  Other chronic and incidental findings as detailed above. Electronically Signed   By: Eddie Candle M.D.   On: 08/28/2018 15:40    Procedures Procedures (including critical care time)  Medications Ordered in ED Medications  albuterol (PROVENTIL) (2.5 MG/3ML) 0.083% nebulizer solution 2.5 mg (2.5 mg Nebulization Given 08/28/18 1423)  iopamidol (ISOVUE-300) 61 % injection 100 mL (80 mLs Intravenous Contrast Given 08/28/18 1505)     Initial Impression / Assessment and Plan / ED Course  I have reviewed the triage vital signs and the nursing notes.  Pertinent labs & imaging results that were available during my care of the patient were reviewed by me and considered in my medical decision making (see chart for details).  85 year old male presents for evaluation after fall.  Fall unwitnessed.  Nonseptic, non ill appearing.  Patient on Coumadin for chronic atrial fibrillation.  Demented at baseline.  He states he has no pain.  Ambulatory at living facility prior to arrival.  He has large hematoma over left  eye. Unable to open eye to assess visual acuity completely. Able to see lower portion of conjunctive a which does not appear erythematous.  EOMs without difficulty, low suspicion for orbital entrapment bilaterally. Right eye normal exam. No midline neck tenderness palpation.  Moves all 4 extremities without difficulty.  Patient has mild expiratory wheezing.  Was recently treated 1 week ago for upper  respiratory infection.  No lower extremity swelling.  Low suspicion for cardiac cause of wheezing.  Patient received 1 nebulizer treatment. On reevaluation lungs clear to auscultation bilaterally without wheeze, rhonchi or rales. Denies SOB, cough. Abdomen soft, non tender without rebound or guarding.  There is mild distention.  No hx of ascites or liver failure. Will obtain basic labs, PT/INR, APTT, CT head CT cervical CT abdomen as well as chest x-rayand reevaluate. Per family, they do not want surgical intervention or invasive measures, however they are requesting imaging.   CBC without leukocytosis, hemoglobin 30.9, metabolic panel with mild elevation in creatinine 1.29, improved from previous creatinine 1.63, 1.72.  INR therapeutic range. Chest xray with cardiomegaly, known CHF on lasix, no evidence of pulmonary edema, infiltrates, probable tiny pleural effusion posterior.  CT head without acute findings, CT cervical spine without acute findings, CT max face with hematoma to left upper quadrant of face.  No evidence of acute fractures.  CT abdomen with possible small ascites.  No evidence of cause on CT scan.  Discussed this with family.  No history of liver failure.  Family states they will follow-up with his outpatient, however do not he additional testing at this time.  Patient hemodynamically stable and appropriate for DC home at this time.  EKG without ischemic changes.  Ambulatory in department that difficulty.  Able to tolerate p.o. intake without difficulty.  Continue normal neurologic exam.  Discussed return precautions with family.  Family knows that we were unable to assess left eye completely secondary to swelling.  Discussed following up with ophthalmologist if patient has difficulties or presenting to the emergency room for reevaluation.  Family voiced understanding return precautions and is agreeable for follow-up.  Patient personally seen and evaluated by my attending physician, Dr. Tamera Punt.   Treatment and plan discussed.   Final Clinical Impressions(s) / ED Diagnoses   Final diagnoses:  Fall, initial encounter  Hematoma  Wheeze    ED Discharge Orders         Ordered    albuterol (PROVENTIL) (5 MG/ML) 0.5% nebulizer solution  Every 6 hours PRN     08/28/18 1702           Lunabelle Oatley A, PA-C 08/28/18 1753    Malvin Johns, MD 08/29/18 1205

## 2018-08-28 NOTE — ED Notes (Signed)
Patient transported to CT 

## 2018-08-29 LAB — URINE CULTURE: Culture: <10000 — AB

## 2018-09-07 ENCOUNTER — Telehealth: Payer: Self-pay | Admitting: Neurology

## 2018-09-07 DIAGNOSIS — F0390 Unspecified dementia without behavioral disturbance: Secondary | ICD-10-CM

## 2018-09-07 DIAGNOSIS — R451 Restlessness and agitation: Secondary | ICD-10-CM

## 2018-09-07 NOTE — Telephone Encounter (Signed)
Pts daughter called to confirm that pt is on sertraline (ZOLOFT) 25 MG tablet Mon-Fri and  sertraline (ZOLOFT) 50 MG tablet Sat and Sun  Pt is also on Seroquel 12.5 mg once a day at bed time.

## 2018-09-07 NOTE — Telephone Encounter (Signed)
Please call daughter back to get more information.

## 2018-09-07 NOTE — Telephone Encounter (Signed)
Daughter paged over the weekend, she would like you to talk to her about her father's behavioral symptoms and how to treat them. Thanks.

## 2018-09-07 NOTE — Telephone Encounter (Signed)
See telephone note from 09/07/18.

## 2018-09-07 NOTE — Addendum Note (Signed)
Addended by: Star Age on: 09/07/2018 05:29 PM   Modules accepted: Orders

## 2018-09-07 NOTE — Telephone Encounter (Signed)
I called pt's daughter, Belenda Cruise, per DPR. She reports that on 08/28/18 pt fell and hit his head, went to ER, had head scans that checked out ok. However, since then, pt seems to be more agitated. The SNF is asking if pt's daughter will let them administer ativan when he becomes agitated but she has declined this. She believes that pt is taking seroquel 12.5mg  QHS, and she is unsure of pt's zoloft dose. Pt does not see a geriatric psychiatrist for behavior issues with his dementia. Pt's daughter feels that the pt is declining. She will call me back with the exact dose of zoloft.

## 2018-09-07 NOTE — Telephone Encounter (Signed)
We can request a geriatric psych referral, I will put referral in, pls talk with daughter. I am not sure how he will do with ativan, maybe PCP can make recommendation for it.

## 2018-09-08 NOTE — Telephone Encounter (Signed)
I called pt's daughter, Belenda Cruise, per DPR, and advised her of Dr. Guadelupe Sabin recommendations. She is agreeable to the geriatric psychiatry referral for pt. I reminded her of pt's appt in April. Pt's daughter will also speak to pt's PCP at [facility] Pt's daughter verbalized understanding of Dr. Guadelupe Sabin recommendations.

## 2018-10-20 ENCOUNTER — Ambulatory Visit: Payer: Medicare Other | Admitting: Neurology

## 2019-01-13 ENCOUNTER — Ambulatory Visit: Payer: Medicare Other | Admitting: Neurology

## 2019-05-13 ENCOUNTER — Telehealth: Payer: Self-pay | Admitting: Neurology

## 2019-05-13 NOTE — Telephone Encounter (Signed)
Please see referral note from psychiatry, please resend referral to geriatric psychiatry, such as Dr. Casimiro Needle or Dr. Adele Schilder.

## 2019-05-17 NOTE — Telephone Encounter (Signed)
Gave printed referral to [facility].

## 2019-05-20 NOTE — Telephone Encounter (Signed)
Referral has been sent to [facility] telephone [phone] fax [phone] .

## 2019-06-17 NOTE — Telephone Encounter (Signed)
Spoke to patient's daughter and she is going to let [facility] deal with the PSY part.

## 2019-07-03 IMAGING — CT CT MAXILLOFACIAL W/O CM
3 of 11 series · 13 of 47 positions shown, 15 images · non-contrast
Comparison: CT brain and cervical spine, 10/27/2017

CLINICAL DATA: Fall, large hematoma to the left eye

EXAM:
CT HEAD WITHOUT CONTRAST
CT MAXILLOFACIAL WITHOUT CONTRAST
CT CERVICAL SPINE WITHOUT CONTRAST
CT ABDOMEN AND PELVIS WITHOUT CONTRAST
TECHNIQUE: Contiguous axial images were obtained from the base of the skull
through the vertex without intravenous contrast. Multidetector CT
imaging of the maxillofacial structures was performed. Multiplanar
CT image reconstructions were also generated. A small metallic BB
was placed on the right temple in order to reliably differentiate
right from left. Multidetector CT imaging of the cervical spine was
performed without intravenous contrast. Multiplanar CT image
reconstructions were also generated. Multidetector CT imaging of the
abdomen and pelvis was performed following the standard protocol
without IV contrast.

[Series 4: head coronal · coronal · 0.32mm/px · 3 of 84 slices shown]
[im 21/84  bone]
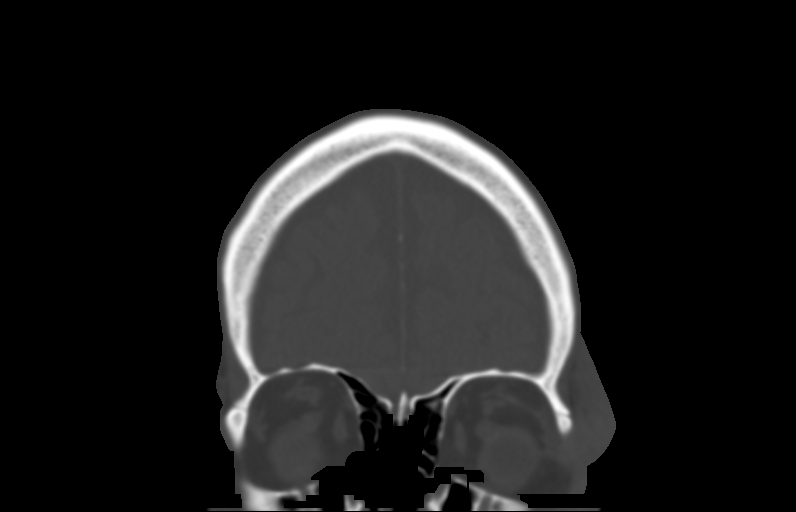
[im 42/84  bone]
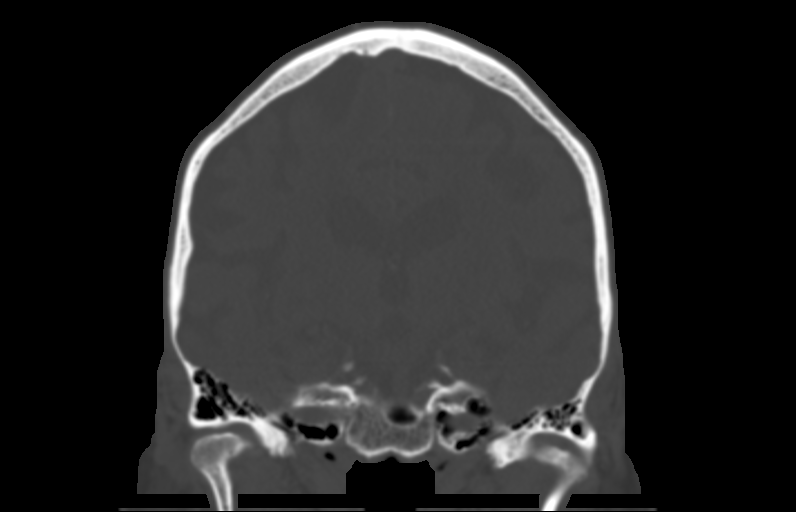
[im 63/84  bone]
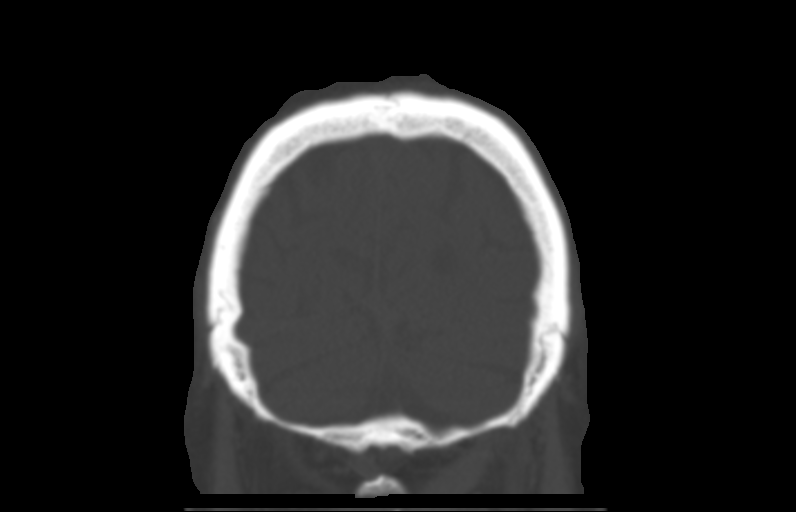

[Series 15: c spine soft · axial · 0.38mm/px · z∈[-286,-242]mm · 3 of 100 slices shown]
[im 12/100  brain]
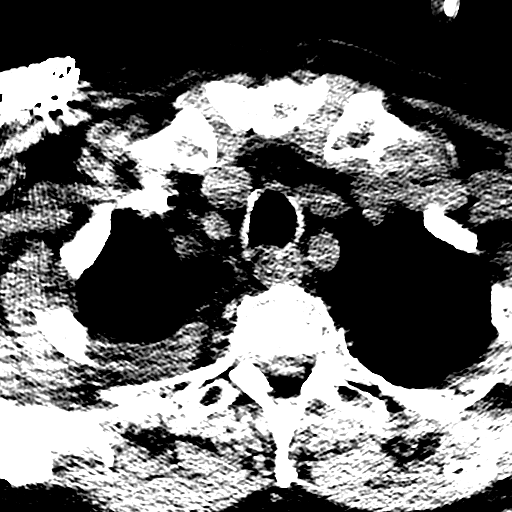
[im 23/100  brain]
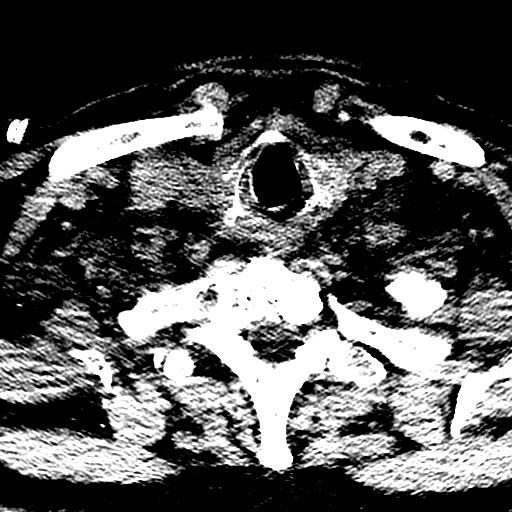
[im 34/100  brain]
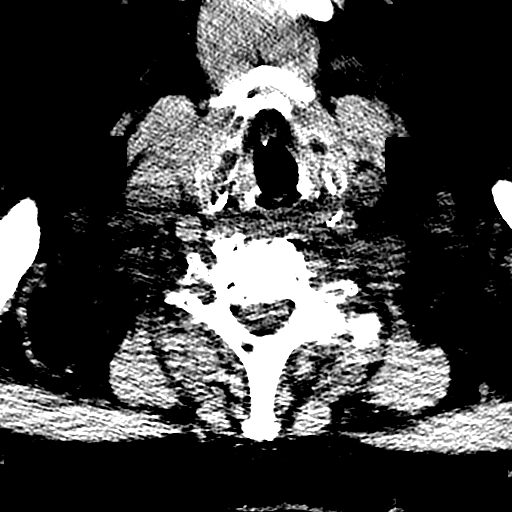

[Series 19: orthogonal axials · axial · 0.30mm/px · z∈[-313,-181]mm · 7 of 99 slices shown, 9 images]
[im 13/99  brain]
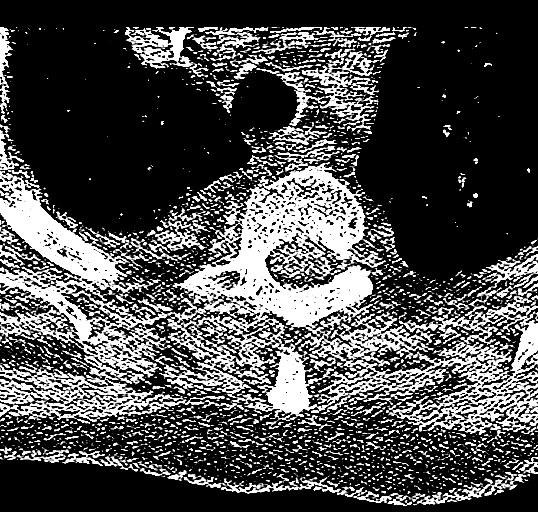
[im 13/99  bone]
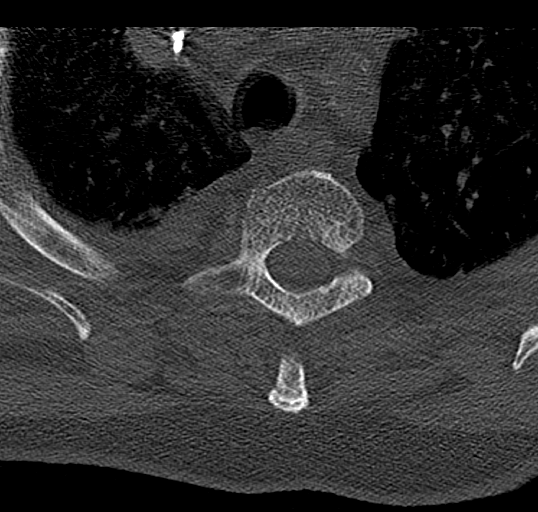
[im 25/99  bone]
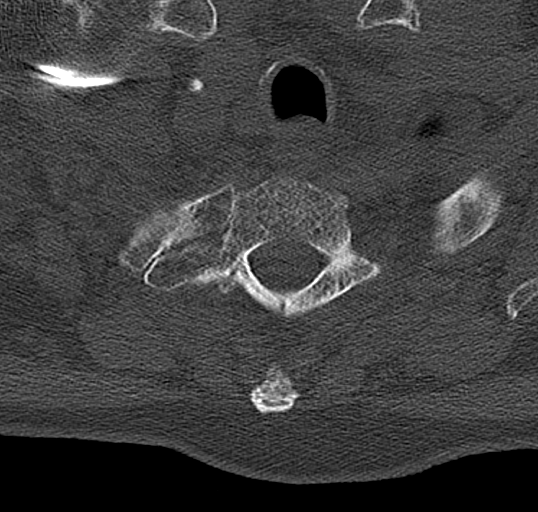
[im 37/99  bone]
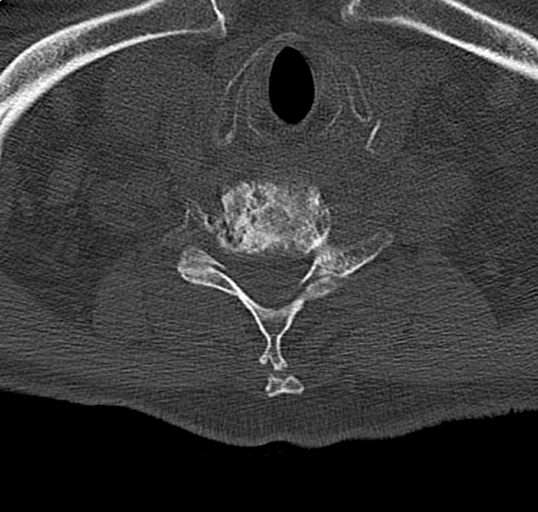
[im 50/99  bone]
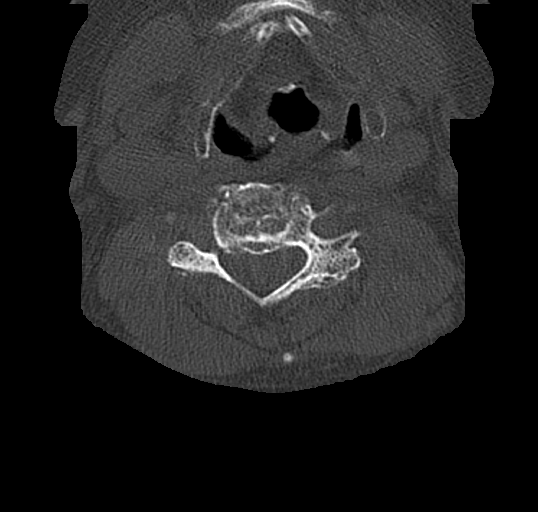
[im 62/99  brain]
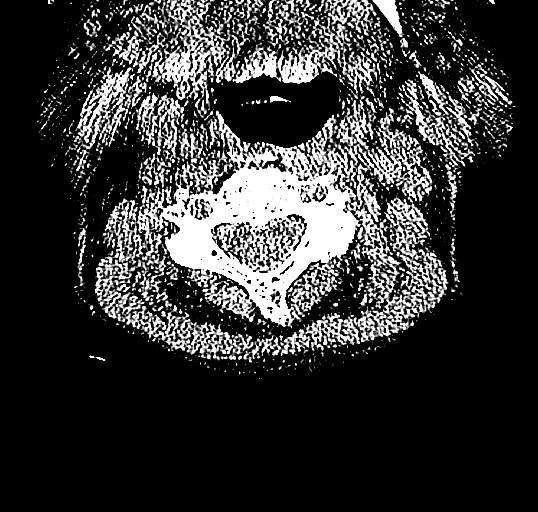
[im 62/99  bone]
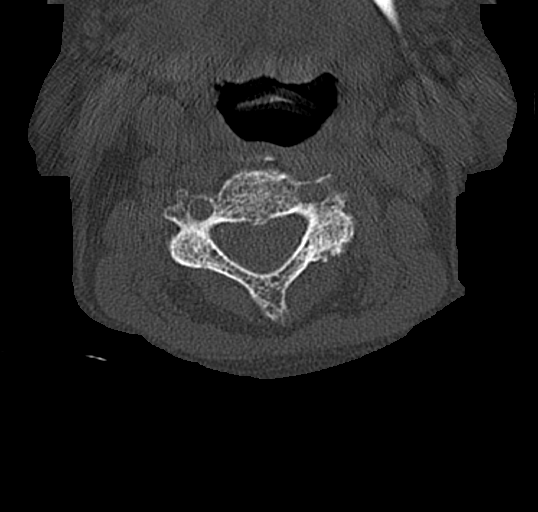
[im 74/99  bone]
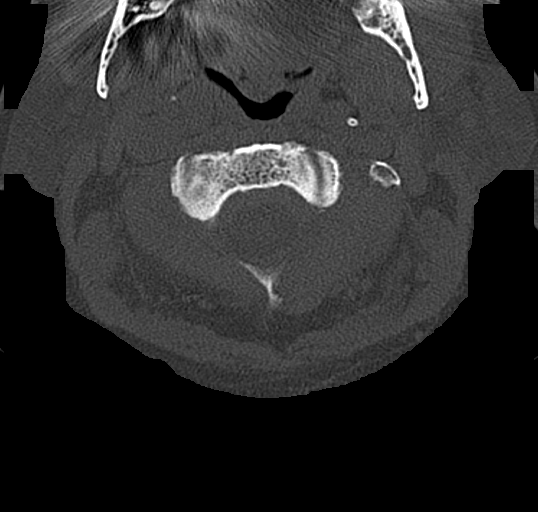
[im 86/99  bone]
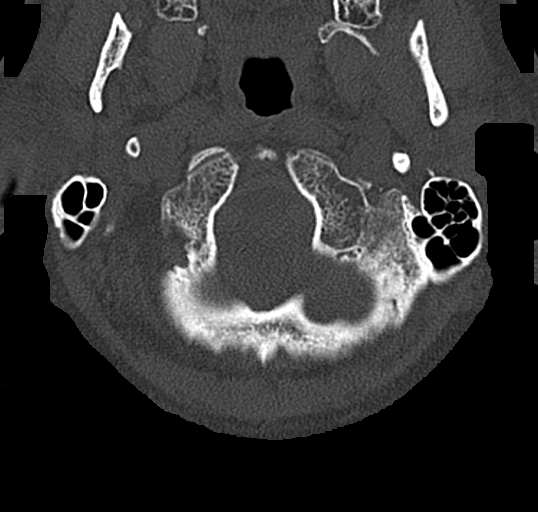

[13 of 47 positions shown; findings below may reference images not displayed]

FINDINGS: CT HEAD FINDINGS

Brain: No evidence of acute infarction, hemorrhage, hydrocephalus,
extra-axial collection or mass lesion/mass effect. Periventricular
white matter hypodensity.

Vascular: No hyperdense vessel or unexpected calcification.

CT FACIAL BONES FINDINGS

Skull: Normal. Negative for fracture or focal lesion.

Facial bones: No displaced fractures or dislocations.

Sinuses/Orbits: No acute finding.

Other: Large soft tissue hematoma overlying the left forehead and
orbit.

CT CERVICAL SPINE FINDINGS

Alignment: Degenerative straightening and reversal of the normal
cervical lordosis.

Skull base and vertebrae: No acute fracture. No primary bone lesion
or focal pathologic process.

Soft tissues and spinal canal: No prevertebral fluid or swelling. No
visible canal hematoma.

Disc levels: Severe multilevel disc degenerative disease and
osteophytosis. Ankylosis of C3-C4.

Upper chest: Right pleural effusion.

Other: None.

Lower chest: Right pleural effusion. Cardiomegaly. Coronary artery
calcifications.

Hepatobiliary: No focal liver abnormality is seen. Coarse hepatic
contour. Small gallstone in the dependent gallbladder. No
gallbladder wall thickening, or biliary dilatation.

Pancreas: Unremarkable. No pancreatic ductal dilatation or
surrounding inflammatory changes.

Spleen: Normal in size without focal abnormality.

Adrenals/Urinary Tract: Adrenal glands are unremarkable. Small
nonobstructive calculus of the superior pole of the left kidney.
Kidneys are otherwise normal, no focal lesion, or hydronephrosis.
Bladder is unremarkable.

Stomach/Bowel: Stomach is within normal limits. Appendix appears
normal. No evidence of bowel wall thickening, distention, or
inflammatory changes. Sigmoid diverticulosis.

Vascular/Lymphatic: Calcific atherosclerosis. No enlarged abdominal
or pelvic lymph nodes.

Reproductive: No mass or other abnormality.

Other: No abdominal wall hernia or abnormality. Small volume 4
quadrant ascites.

Musculoskeletal: No acute or significant osseous findings.
IMPRESSION: 1. No acute intracranial pathology. Small-vessel white matter
disease.

2. No displaced fracture or dislocation of the facial bones. Large
soft tissue hematoma overlying the left forehead and orbit.

3. No fracture or static subluxation of the cervical spine.
Degenerative straightening and reversal of the normal cervical
lordosis with severe multilevel disc degenerative disease and
osteophytosis.

4. Right pleural effusion, partially imaged and without displaced
rib fracture or other causative etiology on included portions of the
upper and lower chest.

5. No definite evidence of acute traumatic injury to the abdomen or
pelvis.

6. Small volume fluid attenuation ascites without specific etiology
identified by CT. The hepatic contour is coarse without overt
stigmata of cirrhosis. Correlate for evidence of chronic liver
disease.

7.  Other chronic and incidental findings as detailed above.

## 2019-10-14 DEATH — deceased
